# Patient Record
Sex: Male | Born: 1960
Health system: Southern US, Community
[De-identification: ages and names within clinical notes are randomized; demographics above are authoritative.]

## PROBLEM LIST (undated history)

## (undated) DIAGNOSIS — I471 Supraventricular tachycardia, unspecified: Secondary | ICD-10-CM

## (undated) DIAGNOSIS — E669 Obesity, unspecified: Secondary | ICD-10-CM

## (undated) DIAGNOSIS — H9313 Tinnitus, bilateral: Secondary | ICD-10-CM

## (undated) DIAGNOSIS — G2581 Restless legs syndrome: Secondary | ICD-10-CM

## (undated) DIAGNOSIS — M1990 Unspecified osteoarthritis, unspecified site: Secondary | ICD-10-CM

## (undated) DIAGNOSIS — J45909 Unspecified asthma, uncomplicated: Secondary | ICD-10-CM

## (undated) DIAGNOSIS — F419 Anxiety disorder, unspecified: Secondary | ICD-10-CM

## (undated) DIAGNOSIS — J302 Other seasonal allergic rhinitis: Secondary | ICD-10-CM

## (undated) DIAGNOSIS — N529 Male erectile dysfunction, unspecified: Secondary | ICD-10-CM

## (undated) HISTORY — DX: Other seasonal allergic rhinitis: J30.2

## (undated) HISTORY — DX: Restless legs syndrome: G25.81

## (undated) HISTORY — DX: Obesity, unspecified: E66.9

## (undated) HISTORY — DX: Anxiety disorder, unspecified: F41.9

## (undated) HISTORY — PX: KNEE SURGERY: SHX244

## (undated) HISTORY — PX: MOHS SURGERY: SHX181

## (undated) HISTORY — PX: OTHER SURGICAL HISTORY: SHX169

## (undated) HISTORY — DX: Unspecified osteoarthritis, unspecified site: M19.90

## (undated) HISTORY — DX: Supraventricular tachycardia, unspecified: I47.10

## (undated) HISTORY — DX: Tinnitus, bilateral: H93.13

---

## 2000-09-23 ENCOUNTER — Emergency Department (HOSPITAL_COMMUNITY): Admission: EM | Admit: 2000-09-23 | Discharge: 2000-09-23 | Payer: Self-pay | Admitting: Emergency Medicine

## 2002-11-10 ENCOUNTER — Encounter: Payer: Self-pay | Admitting: Family Medicine

## 2002-11-10 ENCOUNTER — Encounter: Admission: RE | Admit: 2002-11-10 | Discharge: 2002-11-10 | Payer: Self-pay | Admitting: Family Medicine

## 2004-09-04 ENCOUNTER — Ambulatory Visit: Payer: Self-pay | Admitting: Psychology

## 2004-09-19 ENCOUNTER — Ambulatory Visit: Payer: Self-pay | Admitting: Psychology

## 2004-09-24 ENCOUNTER — Ambulatory Visit: Payer: Self-pay | Admitting: Cardiology

## 2004-10-09 ENCOUNTER — Ambulatory Visit: Payer: Self-pay | Admitting: Psychology

## 2004-10-17 ENCOUNTER — Ambulatory Visit: Payer: Self-pay | Admitting: Psychology

## 2004-11-06 ENCOUNTER — Ambulatory Visit: Payer: Self-pay | Admitting: Psychology

## 2004-11-14 ENCOUNTER — Ambulatory Visit: Payer: Self-pay | Admitting: Psychology

## 2004-11-28 ENCOUNTER — Ambulatory Visit: Payer: Self-pay | Admitting: Psychology

## 2004-12-12 ENCOUNTER — Ambulatory Visit: Payer: Self-pay | Admitting: Psychology

## 2004-12-24 ENCOUNTER — Ambulatory Visit: Payer: Self-pay | Admitting: Psychology

## 2005-01-09 ENCOUNTER — Ambulatory Visit: Payer: Self-pay | Admitting: Psychology

## 2005-01-24 ENCOUNTER — Ambulatory Visit: Payer: Self-pay | Admitting: Psychology

## 2005-02-06 ENCOUNTER — Ambulatory Visit: Payer: Self-pay | Admitting: Psychology

## 2005-02-20 ENCOUNTER — Ambulatory Visit: Payer: Self-pay | Admitting: Psychology

## 2005-03-06 ENCOUNTER — Ambulatory Visit: Payer: Self-pay | Admitting: Psychology

## 2005-03-14 ENCOUNTER — Ambulatory Visit: Payer: Self-pay | Admitting: Psychology

## 2005-03-20 ENCOUNTER — Ambulatory Visit: Payer: Self-pay | Admitting: Psychology

## 2005-04-10 ENCOUNTER — Ambulatory Visit: Payer: Self-pay | Admitting: Psychology

## 2005-04-17 ENCOUNTER — Ambulatory Visit: Payer: Self-pay | Admitting: Psychology

## 2005-04-30 ENCOUNTER — Ambulatory Visit: Payer: Self-pay | Admitting: Psychology

## 2005-05-01 ENCOUNTER — Ambulatory Visit: Payer: Self-pay | Admitting: Pulmonary Disease

## 2005-05-01 ENCOUNTER — Ambulatory Visit: Payer: Self-pay | Admitting: Psychology

## 2005-05-08 ENCOUNTER — Ambulatory Visit: Payer: Self-pay | Admitting: Psychology

## 2005-05-15 ENCOUNTER — Ambulatory Visit: Payer: Self-pay | Admitting: Psychology

## 2005-05-22 ENCOUNTER — Ambulatory Visit: Payer: Self-pay | Admitting: Psychology

## 2005-06-05 ENCOUNTER — Ambulatory Visit: Payer: Self-pay | Admitting: Psychology

## 2005-06-12 ENCOUNTER — Ambulatory Visit: Payer: Self-pay | Admitting: Psychology

## 2005-06-26 ENCOUNTER — Ambulatory Visit: Payer: Self-pay | Admitting: Psychology

## 2005-07-03 ENCOUNTER — Ambulatory Visit: Payer: Self-pay | Admitting: Psychology

## 2005-07-10 ENCOUNTER — Ambulatory Visit: Payer: Self-pay | Admitting: Psychology

## 2005-07-31 ENCOUNTER — Ambulatory Visit: Payer: Self-pay | Admitting: Psychology

## 2005-08-07 ENCOUNTER — Ambulatory Visit: Payer: Self-pay | Admitting: Psychology

## 2005-10-18 ENCOUNTER — Ambulatory Visit: Payer: Self-pay | Admitting: Psychology

## 2005-10-30 ENCOUNTER — Ambulatory Visit: Payer: Self-pay | Admitting: Psychology

## 2005-11-13 ENCOUNTER — Ambulatory Visit: Payer: Self-pay | Admitting: Psychology

## 2005-11-27 ENCOUNTER — Ambulatory Visit: Payer: Self-pay | Admitting: Psychology

## 2005-12-11 ENCOUNTER — Ambulatory Visit: Payer: Self-pay | Admitting: Psychology

## 2005-12-25 ENCOUNTER — Ambulatory Visit: Payer: Self-pay | Admitting: Psychology

## 2006-01-08 ENCOUNTER — Ambulatory Visit: Payer: Self-pay | Admitting: Psychology

## 2006-02-05 ENCOUNTER — Ambulatory Visit: Payer: Self-pay | Admitting: Psychology

## 2006-02-19 ENCOUNTER — Ambulatory Visit: Payer: Self-pay | Admitting: Psychology

## 2006-03-05 ENCOUNTER — Ambulatory Visit: Payer: Self-pay | Admitting: Psychology

## 2006-03-19 ENCOUNTER — Ambulatory Visit: Payer: Self-pay | Admitting: Psychology

## 2006-04-02 ENCOUNTER — Ambulatory Visit: Payer: Self-pay | Admitting: Psychology

## 2006-04-16 ENCOUNTER — Ambulatory Visit: Payer: Self-pay | Admitting: Psychology

## 2006-04-30 ENCOUNTER — Ambulatory Visit: Payer: Self-pay | Admitting: Psychology

## 2006-05-14 ENCOUNTER — Ambulatory Visit: Payer: Self-pay | Admitting: Psychology

## 2006-05-28 ENCOUNTER — Ambulatory Visit: Payer: Self-pay | Admitting: Psychology

## 2006-06-04 ENCOUNTER — Ambulatory Visit: Payer: Self-pay | Admitting: Psychology

## 2006-06-18 ENCOUNTER — Ambulatory Visit: Payer: Self-pay | Admitting: Psychology

## 2006-07-02 ENCOUNTER — Ambulatory Visit: Payer: Self-pay | Admitting: Psychology

## 2006-07-30 ENCOUNTER — Ambulatory Visit: Payer: Self-pay | Admitting: Psychology

## 2006-08-13 ENCOUNTER — Ambulatory Visit: Payer: Self-pay | Admitting: Psychology

## 2006-09-10 ENCOUNTER — Ambulatory Visit: Payer: Self-pay | Admitting: Psychology

## 2006-10-08 ENCOUNTER — Ambulatory Visit: Payer: Self-pay | Admitting: Psychology

## 2006-12-17 ENCOUNTER — Ambulatory Visit: Payer: Self-pay | Admitting: Psychology

## 2007-01-14 ENCOUNTER — Ambulatory Visit: Payer: Self-pay | Admitting: Psychology

## 2007-02-17 ENCOUNTER — Ambulatory Visit: Payer: Self-pay | Admitting: Psychology

## 2007-03-05 ENCOUNTER — Ambulatory Visit: Payer: Self-pay | Admitting: Psychology

## 2007-04-09 ENCOUNTER — Ambulatory Visit: Payer: Self-pay | Admitting: Psychology

## 2007-06-25 ENCOUNTER — Ambulatory Visit: Payer: Self-pay | Admitting: Psychology

## 2007-07-23 ENCOUNTER — Ambulatory Visit: Payer: Self-pay | Admitting: Psychology

## 2007-08-19 ENCOUNTER — Ambulatory Visit: Payer: Self-pay | Admitting: Psychology

## 2007-11-24 ENCOUNTER — Ambulatory Visit: Payer: Self-pay | Admitting: Psychology

## 2008-03-07 ENCOUNTER — Ambulatory Visit: Payer: Self-pay | Admitting: Psychology

## 2008-03-16 ENCOUNTER — Ambulatory Visit: Payer: Self-pay | Admitting: Psychology

## 2008-03-22 ENCOUNTER — Ambulatory Visit: Payer: Self-pay | Admitting: Psychology

## 2008-04-05 ENCOUNTER — Ambulatory Visit: Payer: Self-pay | Admitting: Psychology

## 2008-04-18 ENCOUNTER — Ambulatory Visit: Payer: Self-pay | Admitting: Psychology

## 2008-05-03 ENCOUNTER — Ambulatory Visit: Payer: Self-pay | Admitting: Psychology

## 2008-08-09 ENCOUNTER — Ambulatory Visit: Payer: Self-pay | Admitting: Psychology

## 2013-03-06 ENCOUNTER — Other Ambulatory Visit: Payer: Self-pay | Admitting: Emergency Medicine

## 2013-03-08 NOTE — Telephone Encounter (Signed)
Pt has not been here at all since 2011 and had no record for several visits before that of [place] Rxing it. I did not send in a RF.

## 2013-03-08 NOTE — Telephone Encounter (Signed)
Please pull the patient's paper chart and find out whether we have been writing this prescription. If it is documented in the paper chart that I have written his Cialis and then we can go ahead and refill it. He has had no encounters since we have gone with epic.

## 2013-03-13 ENCOUNTER — Other Ambulatory Visit: Payer: Self-pay | Admitting: Emergency Medicine

## 2013-05-26 ENCOUNTER — Ambulatory Visit (INDEPENDENT_AMBULATORY_CARE_PROVIDER_SITE_OTHER): Payer: BC Managed Care – PPO | Admitting: Physician Assistant

## 2013-05-26 VITALS — BP 141/91 | HR 68 | Temp 98.0°F | Resp 17 | Ht 75.0 in | Wt 243.0 lb

## 2013-05-26 DIAGNOSIS — R5381 Other malaise: Secondary | ICD-10-CM

## 2013-05-26 DIAGNOSIS — R5383 Other fatigue: Secondary | ICD-10-CM

## 2013-05-26 DIAGNOSIS — N529 Male erectile dysfunction, unspecified: Secondary | ICD-10-CM

## 2013-05-26 DIAGNOSIS — F419 Anxiety disorder, unspecified: Secondary | ICD-10-CM | POA: Insufficient documentation

## 2013-05-26 LAB — POCT CBC
Lymph, poc: 2.5 (ref 0.6–3.4)
MCH, POC: 32.4 pg — AB (ref 27–31.2)
MCHC: 32.7 g/dL (ref 31.8–35.4)
MCV: 99.3 fL — AB (ref 80–97)
MID (cbc): 0.4 (ref 0–0.9)
POC Granulocyte: 4.4 (ref 2–6.9)
POC LYMPH PERCENT: 33.6 %L (ref 10–50)
POC MID %: 6 %M (ref 0–12)
Platelet Count, POC: 243 10*3/uL (ref 142–424)
RBC: 5.21 M/uL (ref 4.69–6.13)
RDW, POC: 13.1 %
WBC: 7.3 10*3/uL (ref 4.6–10.2)

## 2013-05-26 LAB — POCT URINALYSIS DIPSTICK
Bilirubin, UA: NEGATIVE
Blood, UA: NEGATIVE
Glucose, UA: NEGATIVE
Protein, UA: NEGATIVE
Spec Grav, UA: 1.025
Urobilinogen, UA: 0.2
pH, UA: 5.5

## 2013-05-26 NOTE — Progress Notes (Signed)
Subjective:    Patient ID: Ruben Snyder, male    DOB: 1961/10/22, 52 y.o.   MRN: [id number]  HPI 52 y.o. male presents with fatigue x 5 days but does feel like it is slightly improved since its onset. Patient has been feeling overly fatigued since Saturday. He was working on his boat over the weekend and feels like his muscles are very "heavy", like he worked out really hard but he states that he has not. He states that he is able to do his usual daily activities but that he just feels extremely worn out and decreased stamina. He is usually pretty physically active and does a lot of work outside. He has had issues with fatigue in the past for which he was told were viral related. He states that this feels very similar. He has history of depression in the past and states that he does not feel depressed at this time but his body feels like it did when he was depressed. He has also had a chronic dry cough for about 1 month that only occurs in the am but no other symptoms. He does have history of allergies but he states they are never bothersome enough to take medications. He has taken some Tylenol the past couple days but unsure if it has helped. He has not had any recent headaches, changes in vision, fever, chills, congestion, abdominal pain, changes in urination or bowel movements. Patient takes Cialis 2x/ week for  About 5 years now without any complications and uses Xanax as needed for anxiety without any complications.   Review of Systems  Constitutional: Positive for fatigue. Negative for fever, chills, activity change and appetite change.  HENT: Positive for tinnitus. Negative for congestion, sore throat, sneezing and sinus pressure.   Eyes: Negative for visual disturbance.  Respiratory: Positive for cough (dry, mostly in the mornings x 1 month). Negative for shortness of breath.   Cardiovascular: Negative for chest pain and palpitations.  Gastrointestinal: Negative for nausea, vomiting and  abdominal pain.  Musculoskeletal: Positive for myalgias (pt states its like muscle soreness or lactic acid build up after working out). Negative for arthralgias.  Skin: Negative for pallor and rash.  Allergic/Immunologic: Positive for environmental allergies.  Neurological: Negative for dizziness, light-headedness and headaches.  Psychiatric/Behavioral: Negative for confusion and sleep disturbance. Nervous/anxious: hx of anxiety    All other systems reviewed and are negative.      Objective:   Physical Exam  Nursing note and vitals reviewed. Constitutional: He is oriented to person, place, and time. Vital signs are normal. He appears well-developed and well-nourished. No distress.  HENT:  Head: Normocephalic and atraumatic.  Nose: Mucosal edema present.  Eyes: Conjunctivae, EOM and lids are normal. Pupils are equal, round, and reactive to light.  Neck: Trachea normal and normal range of motion. Neck supple. No thyromegaly present.  Cardiovascular: Normal rate, regular rhythm and normal heart sounds.   Pulmonary/Chest: Effort normal.  Abdominal: Soft. Bowel sounds are normal.  Musculoskeletal: Normal range of motion.  Lymphadenopathy:    He has no cervical adenopathy.  Neurological: He is alert and oriented to person, place, and time. He has normal strength and normal reflexes. No cranial nerve deficit or sensory deficit. He displays a negative Romberg sign.  Skin: Skin is warm and dry. No rash noted. He is not diaphoretic.  Psychiatric: He has a normal mood and affect. His speech is normal and behavior is normal. Thought content normal. Cognition and memory are normal.  Results for orders placed in visit on 05/26/13  POCT CBC      Result Value Range   WBC 7.3  4.6 - 10.2 K/uL   Lymph, poc 2.5  0.6 - 3.4   POC LYMPH PERCENT 33.6  10 - 50 %L   MID (cbc) 0.4  0 - 0.9   POC MID % 6.0  0 - 12 %M   POC Granulocyte 4.4  2 - 6.9   Granulocyte percent 60.4  37 - 80 %G   RBC 5.21  4.69  - 6.13 M/uL   Hemoglobin 16.9  14.1 - 18.1 g/dL   HCT, POC 47.8  29.5 - 53.7 %   MCV 99.3 (*) 80 - 97 fL   MCH, POC 32.4 (*) 27 - 31.2 pg   MCHC 32.7  31.8 - 35.4 g/dL   RDW, POC 62.1     Platelet Count, POC 243  142 - 424 K/uL   MPV 7.9  0 - 99.8 fL  POCT URINALYSIS DIPSTICK      Result Value Range   Color, UA dark yellow     Clarity, UA slightly cloudy     Glucose, UA negative     Bilirubin, UA negative     Ketones, UA negative     Spec Grav, UA 1.025     Blood, UA negative     pH, UA 5.5     Protein, UA negative     Urobilinogen, UA 0.2     Nitrite, UA negative     Leukocytes, UA Negative         Assessment & Plan:  Other malaise and fatigue - Plan: Comprehensive metabolic panel, POCT CBC, POCT urinalysis dipstick, CK  Patient appears to have fatigue with unsure cause at this time. Discussed with patient fatigue is most likely viral related or due to allergies. All labs were normal today. Pending results of CK and CMP to evaluate cause of myalgias and evaluate kidney function. Offered patient nasal allergy medication or OTC medication for allergies and patient will try OCT Zyrtec. Patient can continue to use Cialis and Xanax as directed. F/u in 1-2 weeks if still having fatigue for further evaluation.

## 2013-05-26 NOTE — Progress Notes (Signed)
I was directly involved with the patient's care and agree with the physical, diagnosis and treatment plan.  

## 2013-05-27 LAB — CK: Total CK: 118 U/L (ref 7–232)

## 2013-05-27 LAB — COMPREHENSIVE METABOLIC PANEL
Albumin: 4.9 g/dL (ref 3.5–5.2)
Alkaline Phosphatase: 50 U/L (ref 39–117)
BUN: 10 mg/dL (ref 6–23)
CO2: 26 mEq/L (ref 19–32)
Calcium: 9.7 mg/dL (ref 8.4–10.5)
Chloride: 103 mEq/L (ref 96–112)
Creat: 1.04 mg/dL (ref 0.50–1.35)
Glucose, Bld: 105 mg/dL — ABNORMAL HIGH (ref 70–99)
Potassium: 4.5 mEq/L (ref 3.5–5.3)
Total Bilirubin: 0.5 mg/dL (ref 0.3–1.2)

## 2013-10-03 ENCOUNTER — Ambulatory Visit (INDEPENDENT_AMBULATORY_CARE_PROVIDER_SITE_OTHER): Payer: BC Managed Care – PPO | Admitting: Emergency Medicine

## 2013-10-03 VITALS — BP 126/78 | HR 79 | Temp 98.3°F | Resp 18 | Ht 75.0 in | Wt 252.0 lb

## 2013-10-03 DIAGNOSIS — M79609 Pain in unspecified limb: Secondary | ICD-10-CM

## 2013-10-03 DIAGNOSIS — M549 Dorsalgia, unspecified: Secondary | ICD-10-CM

## 2013-10-03 DIAGNOSIS — N529 Male erectile dysfunction, unspecified: Secondary | ICD-10-CM

## 2013-10-03 LAB — POCT URINALYSIS DIPSTICK
Bilirubin, UA: NEGATIVE
Blood, UA: NEGATIVE
Glucose, UA: NEGATIVE
Ketones, UA: NEGATIVE
Leukocytes, UA: NEGATIVE
Nitrite, UA: NEGATIVE
Protein, UA: NEGATIVE
Spec Grav, UA: 1.02
Urobilinogen, UA: 0.2
pH, UA: 7

## 2013-10-03 MED ORDER — CYCLOBENZAPRINE HCL 10 MG PO TABS: 10.0000 mg | ORAL_TABLET | Freq: Three times a day (TID) | ORAL | Status: DC | PRN

## 2013-10-03 MED ORDER — TADALAFIL 10 MG PO TABS: ORAL_TABLET | ORAL | Status: DC

## 2013-10-03 NOTE — Patient Instructions (Signed)
Back Pain, Adult Low back pain is very common. About 1 in 5 people have back pain.The cause of low back pain is rarely dangerous. The pain often gets better over time.About half of people with a sudden onset of back pain feel better in just 2 weeks. About 8 in 10 people feel better by 6 weeks.  CAUSES Some common causes of back pain include:  Strain of the muscles or ligaments supporting the spine.  Wear and tear (degeneration) of the spinal discs.  Arthritis.  Direct injury to the back. DIAGNOSIS Most of the time, the direct cause of low back pain is not known.However, back pain can be treated effectively even when the exact cause of the pain is unknown.Answering your caregiver's questions about your overall health and symptoms is one of the most accurate ways to make sure the cause of your pain is not dangerous. If your caregiver needs more information, he or she may order lab work or imaging tests (X-rays or MRIs).However, even if imaging tests show changes in your back, this usually does not require surgery. HOME CARE INSTRUCTIONS For many people, back pain returns.Since low back pain is rarely dangerous, it is often a condition that people can learn to manageon their own.   Remain active. It is stressful on the back to sit or stand in one place. Do not sit, drive, or stand in one place for more than 30 minutes at a time. Take short walks on level surfaces as soon as pain allows.Try to increase the length of time you walk each day.  Do not stay in bed.Resting more than 1 or 2 days can delay your recovery.  Do not avoid exercise or work.Your body is made to move.It is not dangerous to be active, even though your back may hurt.Your back will likely heal faster if you return to being active before your pain is gone.  Pay attention to your body when you bend and lift. Many people have less discomfortwhen lifting if they bend their knees, keep the load close to their bodies,and  avoid twisting. Often, the most comfortable positions are those that put less stress on your recovering back.  Find a comfortable position to sleep. Use a firm mattress and lie on your side with your knees slightly bent. If you lie on your back, put a pillow under your knees.  Only take over-the-counter or prescription medicines as directed by your caregiver. Over-the-counter medicines to reduce pain and inflammation are often the most helpful.Your caregiver may prescribe muscle relaxant drugs.These medicines help dull your pain so you can more quickly return to your normal activities and healthy exercise.  Put ice on the injured area.  Put ice in a plastic bag.  Place a towel between your skin and the bag.  Leave the ice on for 15-20 minutes, 03-04 times a day for the first 2 to 3 days. After that, ice and heat may be alternated to reduce pain and spasms.  Ask your caregiver about trying back exercises and gentle massage. This may be of some benefit.  Avoid feeling anxious or stressed.Stress increases muscle tension and can worsen back pain.It is important to recognize when you are anxious or stressed and learn ways to manage it.Exercise is a great option. SEEK MEDICAL CARE IF:  You have pain that is not relieved with rest or medicine.  You have pain that does not improve in 1 week.  You have new symptoms.  You are generally not feeling well. SEEK   IMMEDIATE MEDICAL CARE IF:   You have pain that radiates from your back into your legs.  You develop new bowel or bladder control problems.  You have unusual weakness or numbness in your arms or legs.  You develop nausea or vomiting.  You develop abdominal pain.  You feel faint. Document Released: 10/21/2005 Document Revised: 04/21/2012 Document Reviewed: 03/11/2011 ExitCare Patient Information 2014 [employer].  

## 2013-10-03 NOTE — Progress Notes (Signed)
   Subjective:    Patient ID: Ruben Snyder, male    DOB: 1960-11-07, 52 y.o.   MRN: [id number]  HPI last night about 7 PM patient started having severe pain in his back. He is having severe muscle spasms and cramps. They come in waves. He denies any radicular symptoms. The pain is much worse if he twists or turns or changes positions. He has no history of kidney stones. Last night he took naproxen and Tylenol soon after that developed midepigastric abdominal pain. He took milk of magnesia with some relief. He denies any urgency or sensation he needs to urinate .    Review of Systems     Objective:   Physical Exam patient had approximately into spasm. He will be talking and then has a cramping sensation in his back. The area of tenderness appears to be over the lumbar spine. Lumbar muscles on the right. Deep tendon reflexes are 2+ and symmetrical motor strength 5 over 5 no weakness. Results for orders placed in visit on 10/03/13  POCT URINALYSIS DIPSTICK      Result Value Range   Color, UA yellow     Clarity, UA clear     Glucose, UA neg     Bilirubin, UA neg     Ketones, UA neg     Spec Grav, UA 1.020     Blood, UA neg     pH, UA 7.0     Protein, UA neg     Urobilinogen, UA 0.2     Nitrite, UA neg     Leukocytes, UA Negative           Assessment & Plan:  Patient does put ice pack to his back. I did put him on Flexeril 10 mg one half to one 3 times a day to take along with extra strength Tylenol a maximum of 6 per day. He does have a history of substance abuse so no addictive medications were prescribed. He also has a history of nonsteroidal GI problems.

## 2013-10-04 ENCOUNTER — Telehealth: Payer: Self-pay

## 2013-10-04 NOTE — Telephone Encounter (Signed)
Do you want to change the mg of the Cialis?

## 2013-10-04 NOTE — Telephone Encounter (Signed)
Patient's pharmacy [facility]  called requesting to change patients tadalafil (CIALIS) 10 MG tablet  to something cheaper like the 20mg  for the patient. Also the pharmacists says that the insurance will request a prior authorization too. Please advise

## 2013-10-05 MED ORDER — TADALAFIL 20 MG PO TABS: 10.0000 mg | ORAL_TABLET | Freq: Every day | ORAL | Status: DC | PRN

## 2013-10-05 NOTE — Telephone Encounter (Signed)
Okay, and Cialis 20 mg. He can take one half to one tab prior to intercourse he can have #6 tablets he can take the medication every 48 hours. He can have refills for one year.

## 2013-10-06 ENCOUNTER — Telehealth: Payer: Self-pay | Admitting: *Deleted

## 2013-10-06 NOTE — Telephone Encounter (Signed)
cialis approved from 09/06/13 til 10/05/16

## 2013-10-11 ENCOUNTER — Telehealth: Payer: Self-pay | Admitting: Radiology

## 2013-10-11 NOTE — Telephone Encounter (Signed)
cialis approved through 10/2016 left message for patient to advise.

## 2013-11-30 ENCOUNTER — Ambulatory Visit (INDEPENDENT_AMBULATORY_CARE_PROVIDER_SITE_OTHER): Payer: BC Managed Care – PPO | Admitting: Emergency Medicine

## 2013-11-30 VITALS — BP 144/80 | HR 109 | Temp 97.8°F | Resp 20 | Ht 75.0 in | Wt 251.6 lb

## 2013-11-30 DIAGNOSIS — J029 Acute pharyngitis, unspecified: Secondary | ICD-10-CM

## 2013-11-30 MED ORDER — PENICILLIN V POTASSIUM 500 MG PO TABS: 500.0000 mg | ORAL_TABLET | Freq: Four times a day (QID) | ORAL | Status: DC

## 2013-11-30 MED ORDER — PREDNISONE 20 MG PO TABS: 40.0000 mg | ORAL_TABLET | Freq: Every day | ORAL | Status: DC

## 2013-11-30 NOTE — Patient Instructions (Signed)
Pharyngitis °Pharyngitis is redness, pain, and swelling (inflammation) of your pharynx.  °CAUSES  °Pharyngitis is usually caused by infection. Most of the time, these infections are from viruses (viral) and are part of a cold. However, sometimes pharyngitis is caused by bacteria (bacterial). Pharyngitis can also be caused by allergies. Viral pharyngitis may be spread from person to person by coughing, sneezing, and personal items or utensils (cups, forks, spoons, toothbrushes). Bacterial pharyngitis may be spread from person to person by more intimate contact, such as kissing.  °SIGNS AND SYMPTOMS  °Symptoms of pharyngitis include:   °· Sore throat.   °· Tiredness (fatigue).   °· Low-grade fever.   °· Headache. °· Joint pain and muscle aches. °· Skin rashes. °· Swollen lymph nodes. °· Plaque-like film on throat or tonsils (often seen with bacterial pharyngitis). °DIAGNOSIS  °Your health care provider will ask you questions about your illness and your symptoms. Your medical history, along with a physical exam, is often all that is needed to diagnose pharyngitis. Sometimes, a rapid strep test is done. Other lab tests may also be done, depending on the suspected cause.  °TREATMENT  °Viral pharyngitis will usually get better in 3 4 days without the use of medicine. Bacterial pharyngitis is treated with medicines that kill germs (antibiotics).  °HOME CARE INSTRUCTIONS  °· Drink enough water and fluids to keep your urine clear or pale yellow.   °· Only take over-the-counter or prescription medicines as directed by your health care provider:   °· If you are prescribed antibiotics, make sure you finish them even if you start to feel better.   °· Do not take aspirin.   °· Get lots of rest.   °· Gargle with 8 oz of salt water (½ tsp of salt per 1 qt of water) as often as every 1 2 hours to soothe your throat.   °· Throat lozenges (if you are not at risk for choking) or sprays may be used to soothe your throat. °SEEK MEDICAL  CARE IF:  °· You have large, tender lumps in your neck. °· You have a rash. °· You cough up green, yellow-brown, or bloody spit. °SEEK IMMEDIATE MEDICAL CARE IF:  °· Your neck becomes stiff. °· You drool or are unable to swallow liquids. °· You vomit or are unable to keep medicines or liquids down. °· You have severe pain that does not go away with the use of recommended medicines. °· You have trouble breathing (not caused by a stuffy nose). °MAKE SURE YOU:  °· Understand these instructions. °· Will watch your condition. °· Will get help right away if you are not doing well or get worse. °Document Released: 10/21/2005 Document Revised: 08/11/2013 Document Reviewed: 06/28/2013 °ExitCare® Patient Information ©2014 [employer]. ° °

## 2013-11-30 NOTE — Progress Notes (Signed)
   Subjective:    Patient ID: Ruben Snyder, male    DOB: Nov 23, 1960, 53 y.o.   MRN: [id number]  HPI Sore throat x 2-3 days with one episode of emesis while traveling several days ago.  Symptoms caused him to miss his flight.  No other n/v.  States stomach feeling better, but throat worsening.  No headache.  No cough.  No f/c.  PPMH:  No diabetes, no history of htn.  SH:  Non smoker  ALL:  States NSAIDS irritate his stomach and does not tolerate them.  Review of Systems  Constitutional: Negative for fever and chills.  HENT: Positive for sore throat and trouble swallowing. Negative for congestion, ear pain, postnasal drip, rhinorrhea and voice change.   Respiratory: Negative for cough, choking, shortness of breath, wheezing and stridor.   Cardiovascular: Negative for chest pain.  Gastrointestinal: Positive for nausea and vomiting. Negative for diarrhea and constipation.  Endocrine: Positive for heat intolerance.  Musculoskeletal: Negative for myalgias.  Skin: Negative for rash.  Neurological: Negative for weakness, light-headedness, numbness and headaches.       Objective:   Physical Exam Blood pressure 144/80, pulse 109, temperature 97.8 F (36.6 C), temperature source Oral, resp. rate 20, height 6\' 3"  (1.905 m), weight 251 lb 9.6 oz (114.125 kg), SpO2 94.00%. Body mass index is 31.45 kg/(m^2). Well-developed, well nourished male  who is awake, alert and oriented, in NAD. HEENT: [place]/AT, PERRL, EOMI.  Sclera and conjunctiva are clear.  EAC are patent, TMs are normal in appearance. Nasal mucosa is pink and moist. OP is erythematous with midline uvula and no assymetry. Neck: supple, non-tender, positive adenopathy, thyromegaly. Heart: RRR, no murmur Lungs: normal effort, CTA Abdomen: normo-active bowel sounds, supple, non-tender, no mass or organomegaly. Extremities: no cyanosis, clubbing or edema. Skin: warm and dry without rash. Psychologic: good mood and appropriate affect,  normal speech and behavior.     Assessment & Plan:  Pharyngitis - in light of symptoms and discomfort with start on prednisone burst for inflammation/swelling and will start on penicillin.  Follow up as needed.

## 2014-10-04 ENCOUNTER — Other Ambulatory Visit: Payer: Self-pay | Admitting: Emergency Medicine

## 2014-10-04 NOTE — Telephone Encounter (Signed)
Dr Cleta Albertsaub, didn't know if you want to give pt 1 mos RF w/note to RTC, or just deny. Pended for review.

## 2014-11-03 ENCOUNTER — Ambulatory Visit (INDEPENDENT_AMBULATORY_CARE_PROVIDER_SITE_OTHER): Payer: BC Managed Care – PPO | Admitting: Physician Assistant

## 2014-11-03 ENCOUNTER — Other Ambulatory Visit: Payer: Self-pay | Admitting: Emergency Medicine

## 2014-11-03 VITALS — BP 138/70 | HR 69 | Temp 98.0°F | Resp 16 | Ht 74.75 in | Wt 224.0 lb

## 2014-11-03 DIAGNOSIS — R634 Abnormal weight loss: Secondary | ICD-10-CM

## 2014-11-03 DIAGNOSIS — N529 Male erectile dysfunction, unspecified: Secondary | ICD-10-CM

## 2014-11-03 MED ORDER — TADALAFIL 20 MG PO TABS: ORAL_TABLET | ORAL | Status: DC

## 2014-11-03 NOTE — Progress Notes (Signed)
    IDENTIFYING INFORMATION  Ruben Snyder / DOB: [birth year] / MRN: [id number]  The patient has Anxiety and Impotence on his problem list.  SUBJECTIVE  CC: Medication Refill and cialis   HPI: Mr. Ruben Snyder is a 53 y.o. y.o. male presenting for a refill of his Cialis.  He reports breaking the 20 mg tabs in half and uses the medication 12 times monthly.  He denies any difficulty with this medication. He denies orthostasis, vision changes and priapism.He has no history of CAD spectrum disorder.    Of note, he has lost roughly 20 lbs over the last year with diet changes.  He reports the cessation of highly processed foods.  He eats lost of vegetables and whole foods, and reports his BP is much better than previous.      He  has a past medical history of Depression and Anxiety.    He has a current medication list which includes the following prescription(s): alprazolam.  Mr. Ruben Snyder is allergic to nsaids and reglan. He  reports that he has been smoking Cigarettes.  He has been smoking about 0.00 packs per day. He has never used smokeless tobacco. He reports that he drinks alcohol. He reports that he does not use illicit drugs. He  reports that he currently engages in sexual activity. He reports using the following method of birth control/protection: Condom.  The patient  has no past surgical history on file.  His family history is not on file.  Review of Systems  Eyes: Negative.   Respiratory: Negative for shortness of breath.   Cardiovascular: Negative for palpitations.  Gastrointestinal: Negative for heartburn, nausea and abdominal pain.  Skin: Negative.   Neurological: Negative for dizziness and headaches.    OBJECTIVE  Blood pressure 138/70, pulse 69, temperature 98 F (36.7 C), temperature source Oral, resp. rate 16, height 6' 2.75" (1.899 m), weight 224 lb (101.606 kg), SpO2 98 %. The patient's body mass index is 28.18 kg/(m^2).  Physical Exam  Constitutional: He is  oriented to person, place, and time. He appears well-developed and well-nourished.  Cardiovascular: Normal rate and regular rhythm.   Respiratory: Effort normal and breath sounds normal.  GI: He exhibits no distension.  Musculoskeletal: Normal range of motion.  Neurological: He is alert and oriented to person, place, and time.  Skin: Skin is warm and dry.  Psychiatric: He has a normal mood and affect. His behavior is normal. Judgment and thought content normal.    No results found for this or any previous visit (from the past 24 hour(s)).  ASSESSMENT & PLAN  Ruben Snyder was seen today for medication refill and cialis.  Diagnoses and associated orders for this visit:  Erectile dysfunction, unspecified erectile dysfunction type - tadalafil (CIALIS) 20 MG tablet; Take 0.5 - 1 tab by mouth daily as needed.  Loss of weight -     Encouraged patient to continue his current weight loss regimen.      The patient was instructed to to call or comeback to clinic as needed, or should symptoms warrant.  Ruben Snyder, MHS, PA-C [facility] 11/03/2014 3:27 PM

## 2014-11-23 ENCOUNTER — Other Ambulatory Visit: Payer: Self-pay | Admitting: Family Medicine

## 2015-11-18 ENCOUNTER — Other Ambulatory Visit: Payer: Self-pay | Admitting: Family Medicine

## 2015-11-18 NOTE — Telephone Encounter (Signed)
Pt is requesting a refill of Cialis. He says he needs this ASAP.

## 2015-11-21 NOTE — Telephone Encounter (Signed)
Will refill for 1 month but will need a follow up for further refills

## 2015-12-12 ENCOUNTER — Other Ambulatory Visit: Payer: Self-pay

## 2015-12-12 ENCOUNTER — Ambulatory Visit: Payer: Self-pay | Admitting: Physician Assistant

## 2015-12-12 NOTE — Telephone Encounter (Signed)
Casimiro Needle,   [name].  He has reschedule his appointment to 01/10/2016 @ 4:00 pm, which was your first available appointment from 12/12/2015 cancellation.  He will run out in Feb.  [facility] [address] PT# [phone]

## 2015-12-14 MED ORDER — TADALAFIL 20 MG PO TABS: ORAL_TABLET | ORAL | Status: DC

## 2016-01-10 ENCOUNTER — Ambulatory Visit: Payer: Self-pay | Admitting: Physician Assistant

## 2016-01-31 ENCOUNTER — Ambulatory Visit (INDEPENDENT_AMBULATORY_CARE_PROVIDER_SITE_OTHER): Payer: BLUE CROSS/BLUE SHIELD | Admitting: Physician Assistant

## 2016-01-31 ENCOUNTER — Encounter: Payer: Self-pay | Admitting: Physician Assistant

## 2016-01-31 VITALS — BP 104/69 | HR 71 | Temp 98.1°F | Resp 16 | Ht 72.75 in | Wt 218.8 lb

## 2016-01-31 DIAGNOSIS — H9313 Tinnitus, bilateral: Secondary | ICD-10-CM | POA: Diagnosis not present

## 2016-01-31 MED ORDER — ALPRAZOLAM ER 1 MG PO TB24: 1.0000 mg | ORAL_TABLET | Freq: Every day | ORAL | Status: DC

## 2016-01-31 NOTE — Patient Instructions (Signed)
     IF you received an x-ray today, you will receive an invoice from [facility]. Please contact [facility] at [phone] with questions or concerns regarding your invoice.   IF you received labwork today, you will receive an invoice from [facility]. Please contact [facility] at [phone] with questions or concerns regarding your invoice.   Our billing staff will not be able to assist you with questions regarding bills from these companies.  You will be contacted with the lab results as soon as they are available. The fastest way to get your results is to activate your My Chart account. Instructions are located on the last page of this paperwork. If you have not heard from us regarding the results in 2 weeks, please contact this office.      

## 2016-01-31 NOTE — Progress Notes (Signed)
   02/07/2016 1:23 PM   DOB: Mar 19, 1961 / MRN: [id number]  SUBJECTIVE:  Ruben Snyder is a 55 y.o. male presenting for a refill of Xanax.  He reports a history of tinnitus and was placed on this by ENT, as they could find no etiology driving the tinnitus.  He is tearful in relaying this experience to me, and reports he only needs Xanax roughly 1-2 times per week, if that, to manage his emotional response to the tinnitus.    He is allergic to nsaids and reglan.   He  has a past medical history of Depression and Anxiety.    He  reports that he has been smoking Cigarettes.  He has never used smokeless tobacco. He reports that he drinks alcohol. He reports that he does not use illicit drugs. He  reports that he currently engages in sexual activity. He reports using the following method of birth control/protection: Condom. The patient  has no past surgical history on file.  His family history is not on file.  Review of Systems  Constitutional: Negative for fever and chills.  HENT: Negative for congestion and sore throat.   Eyes: Negative for photophobia.  Gastrointestinal: Negative for nausea.  Skin: Negative for rash.  Neurological: Negative for dizziness and headaches.    Problem list and medications reviewed and updated by myself where necessary, and exist elsewhere in the encounter.   OBJECTIVE:  BP 104/69 mmHg  Pulse 71  Temp(Src) 98.1 F (36.7 C) (Oral)  Resp 16  Ht 6' 0.75" (1.848 m)  Wt 218 lb 12.8 oz (99.247 kg)  BMI 29.06 kg/m2  Physical Exam  Constitutional: He is oriented to person, place, and time. He appears well-developed. He does not appear ill.  Eyes: Conjunctivae and EOM are normal. Pupils are equal, round, and reactive to light.  Cardiovascular: Normal rate and regular rhythm.   Pulmonary/Chest: Effort normal and breath sounds normal.  Abdominal: He exhibits no distension.  Musculoskeletal: Normal range of motion.  Neurological: He is alert and oriented to  person, place, and time. No cranial nerve deficit. Coordination normal.  Skin: Skin is warm and dry. He is not diaphoretic.  Psychiatric: He has a normal mood and affect.  Nursing note and vitals reviewed.   No results found for this or any previous visit (from the past 72 hour(s)).  No results found.  ASSESSMENT AND PLAN  Ruben Snyder was seen today for medication refill.  Diagnoses and all orders for this visit:  Tinnitus, bilateral: I think he may have some PTSD regarding this health scare.  He is very intelligent and did his own research prior to being diagnosed with a benign tinnitus.  Will continue his benzodiazepine therapy to manage acute episodes of panic associated with the disorder.  He is also using noise cancellation therapy and reports this is working.  Advised he continue this.    -     ALPRAZolam (XANAX XR) 1 MG 24 hr tablet; Take 1 tablet (1 mg total) by mouth daily.    The patient was advised to call or return to clinic if he does not see an improvement in symptoms or to seek the care of the closest emergency department if he worsens with the above plan.   Deliah BostonMichael Temeca Somma, MHS, PA-C [facility] 02/07/2016 1:23 PM

## 2016-02-19 ENCOUNTER — Ambulatory Visit (INDEPENDENT_AMBULATORY_CARE_PROVIDER_SITE_OTHER): Payer: BLUE CROSS/BLUE SHIELD | Admitting: Family Medicine

## 2016-02-19 ENCOUNTER — Telehealth: Payer: Self-pay

## 2016-02-19 ENCOUNTER — Ambulatory Visit (INDEPENDENT_AMBULATORY_CARE_PROVIDER_SITE_OTHER): Payer: BLUE CROSS/BLUE SHIELD

## 2016-02-19 VITALS — BP 118/80 | HR 73 | Temp 97.9°F | Resp 18 | Ht 72.75 in | Wt 215.6 lb

## 2016-02-19 DIAGNOSIS — J9801 Acute bronchospasm: Secondary | ICD-10-CM | POA: Diagnosis not present

## 2016-02-19 DIAGNOSIS — R079 Chest pain, unspecified: Secondary | ICD-10-CM

## 2016-02-19 DIAGNOSIS — J301 Allergic rhinitis due to pollen: Secondary | ICD-10-CM | POA: Diagnosis not present

## 2016-02-19 MED ORDER — FLUTICASONE PROPIONATE 50 MCG/ACT NA SUSP: 2.0000 | Freq: Every day | NASAL | Status: DC

## 2016-02-19 MED ORDER — ALBUTEROL SULFATE HFA 108 (90 BASE) MCG/ACT IN AERS: 2.0000 | INHALATION_SPRAY | Freq: Four times a day (QID) | RESPIRATORY_TRACT | Status: DC | PRN

## 2016-02-19 NOTE — Telephone Encounter (Signed)
Patient was seen today and is calling because the medication sent is not in stock at the pharmacy. Please advise!

## 2016-02-19 NOTE — Progress Notes (Signed)
Subjective:    Patient ID: Ruben Snyder, male    DOB: 06-21-1961, 55 y.o.   MRN: [id number]  02/19/2016  Wheezing   HPI This 55 y.o. male presents for evaluation of wheezing.  Onset six weeks ago; suffers with nighttime wheezing.  Almost whistling at times.  Tried Zyrtec for two days without improvement.  Only happens at nighttime.  Wakes up in morning congested and sneezing. Now feeling L sided chest pain.  Not sure if coughing something loose or if related to lungs.   Does not feel badly; no SOB.  Unusual.  Realizes is allergy time of year. When breathing in now, feels L anterior chest pain with deep inspiration mild. Feels with each breath.  Rubs tissue without soreness.  No fever/chills/sweats; no body aches.  No ear pain or sore throat.  +rhinorrhea; no sinus pressure.  No PND.  +sputum production with wheezing at night.  Clear.  No v/d.  No other medications other than Zyrtec.  Can tolerate Claritin.  Cannot tolerate Benadryl.  No tobacco; quit smoking 15 years ago.  No childhood asthma.  Some seasonal allergies.  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  No new homes.  Smoked for ten years.   Review of Systems  Constitutional: Negative for fever, chills, diaphoresis, activity change, appetite change and fatigue.  HENT: Positive for congestion, rhinorrhea and sneezing. Negative for ear pain and sore throat.   Respiratory: Positive for chest tightness and wheezing. Negative for cough and shortness of breath.   Cardiovascular: Positive for chest pain. Negative for palpitations and leg swelling.  Gastrointestinal: Negative for nausea, vomiting, abdominal pain and diarrhea.  Endocrine: Negative for cold intolerance, heat intolerance, polydipsia, polyphagia and polyuria.  Skin: Negative for color change, rash and wound.  Neurological: Negative for dizziness, tremors, seizures, syncope, facial asymmetry, speech difficulty, weakness, light-headedness, numbness and headaches.  Psychiatric/Behavioral:  Negative for sleep disturbance and dysphoric mood. The patient is not nervous/anxious.     Past Medical History  Diagnosis Date  . Depression   . Anxiety    History reviewed. No pertinent past surgical history. Allergies  Allergen Reactions  . Nsaids   . Reglan [Metoclopramide]     Social History   Social History  . Marital Status: Divorced    Spouse Name: N/A  . Number of Children: N/A  . Years of Education: N/A   Occupational History  . Not on file.   Social History Main Topics  . Smoking status: Current Some Day Smoker    Types: Cigarettes  . Smokeless tobacco: Never Used  . Alcohol Use: Yes  . Drug Use: No  . Sexual Activity: Yes    Birth Control/ Protection: Condom   Other Topics Concern  . Not on file   Social History Narrative   History reviewed. No pertinent family history.     Objective:    BP 118/80 mmHg  Pulse 73  Temp(Src) 97.9 F (36.6 C) (Oral)  Resp 18  Ht 6' 0.75" (1.848 m)  Wt 215 lb 9.6 oz (97.796 kg)  BMI 28.64 kg/m2  SpO2 97% Physical Exam  Constitutional: He is oriented to person, place, and time. He appears well-developed and well-nourished. No distress.  HENT:  Head: Normocephalic and atraumatic.  Right Ear: Tympanic membrane, external ear and ear canal normal.  Left Ear: Tympanic membrane, external ear and ear canal normal.  Nose: Nose normal.  Mouth/Throat: Oropharynx is clear and moist.  Eyes: Conjunctivae and EOM are normal. Pupils are equal, round,  and reactive to light.  Neck: Normal range of motion. Neck supple. Carotid bruit is not present. No thyromegaly present.  Cardiovascular: Normal rate, regular rhythm, normal heart sounds and intact distal pulses.  Exam reveals no gallop and no friction rub.   No murmur heard. Pulmonary/Chest: Effort normal and breath sounds normal. He has no wheezes. He has no rales.  Abdominal: Soft. Bowel sounds are normal. He exhibits no distension and no mass. There is no tenderness. There  is no rebound and no guarding.  Lymphadenopathy:    He has no cervical adenopathy.  Neurological: He is alert and oriented to person, place, and time. No cranial nerve deficit.  Skin: Skin is warm and dry. No rash noted. He is not diaphoretic.  Psychiatric: He has a normal mood and affect. His behavior is normal.  Nursing note and vitals reviewed.   Dg Chest 2 View  02/19/2016  CLINICAL DATA:  Left-sided chest pain EXAM: CHEST  2 VIEW COMPARISON:  07/27/2007 FINDINGS: The heart size and mediastinal contours are within normal limits. Both lungs are clear. The visualized skeletal structures are unremarkable. IMPRESSION: No active cardiopulmonary disease. Electronically Signed   By: Alcide Clever M.D.   On: 02/19/2016 19:25        Assessment & Plan:   1. Bronchospasm   2. Allergic rhinitis due to pollen   3. Left sided chest pain    -New. -Rx for Flonase, Albuterol provided. -Recommend Claritin  daily.   Orders Placed This Encounter  Procedures  . DG Chest 2 View    Standing Status: Future     Number of Occurrences: 1     Standing Expiration Date: 02/18/2017    Order Specific Question:  Reason for Exam (SYMPTOM  OR DIAGNOSIS REQUIRED)    Answer:  wheezing, L sided chest pain    Order Specific Question:  Preferred imaging location?    Answer:  External   Meds ordered this encounter  Medications  . albuterol (PROVENTIL HFA;VENTOLIN HFA) 108 (90 Base) MCG/ACT inhaler    Sig: Inhale 2 puffs into the lungs every 6 (six) hours as needed.    Dispense:  1 Inhaler    Refill:  1  . fluticasone (FLONASE) 50 MCG/ACT nasal spray    Sig: Place 2 sprays into both nostrils daily.    Dispense:  16 g    Refill:  6    No Follow-up on file.    Zaydn Gutridge Paulita Fujita, M.D. [facility] [address] [place], [place]  [address] [phone] phone [phone] fax

## 2016-02-19 NOTE — Patient Instructions (Addendum)
1. Take Claritin  one tablet daily.    IF you received an x-ray today, you will receive an invoice from [facility]. Please contact [facility] at [phone] with questions or concerns regarding your invoice.   IF you received labwork today, you will receive an invoice from [employer]. Please contact [facility] at [phone] with questions or concerns regarding your invoice.   Our billing staff will not be able to assist you with questions regarding bills from these companies.  You will be contacted with the lab results as soon as they are available. The fastest way to get your results is to activate your My Chart account. Instructions are located on the last page of this paperwork. If you have not heard from [place] regarding the results in 2 weeks, please contact this office.    Dg Chest 2 View  02/19/2016  CLINICAL DATA:  Left-sided chest pain EXAM: CHEST  2 VIEW COMPARISON:  07/27/2007 FINDINGS: The heart size and mediastinal contours are within normal limits. Both lungs are clear. The visualized skeletal structures are unremarkable. IMPRESSION: No active cardiopulmonary disease. Electronically Signed   By: Alcide Clever M.D.   On: 02/19/2016 19:25    Allergic Rhinitis Allergic rhinitis is when the mucous membranes in the nose respond to allergens. Allergens are particles in the air that cause your body to have an allergic reaction. This causes you to release allergic antibodies. Through a chain of events, these eventually cause you to release histamine into the blood stream. Although meant to protect the body, it is this release of histamine that causes your discomfort, such as frequent sneezing, congestion, and an itchy, runny nose.  CAUSES Seasonal allergic rhinitis (hay fever) is caused by pollen allergens that may come from grasses, trees, and weeds. Year-round allergic rhinitis (perennial allergic rhinitis) is caused by allergens such as  house dust mites, pet dander, and mold spores. SYMPTOMS  Nasal stuffiness (congestion).  Itchy, runny nose with sneezing and tearing of the eyes. DIAGNOSIS Your health care provider can help you determine the allergen or allergens that trigger your symptoms. If you and your health care provider are unable to determine the allergen, skin or blood testing may be used. Your health care provider will diagnose your condition after taking your health history and performing a physical exam. Your health care provider may assess you for other related conditions, such as asthma, pink eye, or an ear infection. TREATMENT Allergic rhinitis does not have a cure, but it can be controlled by:  Medicines that block allergy symptoms. These may include allergy shots, nasal sprays, and oral antihistamines.  Avoiding the allergen. Hay fever may often be treated with antihistamines in pill or nasal spray forms. Antihistamines block the effects of histamine. There are over-the-counter medicines that may help with nasal congestion and swelling around the eyes. Check with your health care provider before taking or giving this medicine. If avoiding the allergen or the medicine prescribed do not work, there are many new medicines your health care provider can prescribe. Stronger medicine may be used if initial measures are ineffective. Desensitizing injections can be used if medicine and avoidance does not work. Desensitization is when a patient is given ongoing shots until the body becomes less sensitive to the allergen. Make sure you follow up with your health care provider if problems continue. HOME CARE INSTRUCTIONS It is not possible to completely avoid allergens, but you can reduce your symptoms by taking steps to limit your exposure to  them. It helps to know exactly what you are allergic to so that you can avoid your specific triggers. SEEK MEDICAL CARE IF:  You have a fever.  You develop a cough that does not  stop easily (persistent).  You have shortness of breath.  You start wheezing.  Symptoms interfere with normal daily activities.   This information is not intended to replace advice given to you by your health care provider. Make sure you discuss any questions you have with your health care provider.   Document Released: 07/16/2001 Document Revised: 11/11/2014 Document Reviewed: 06/28/2013 Elsevier Interactive Patient Education [employer]2016 Elsevier Inc.

## 2016-02-20 NOTE — Telephone Encounter (Signed)
Called pt, he states he got his medicine.

## 2016-03-26 ENCOUNTER — Telehealth: Payer: Self-pay

## 2016-03-26 NOTE — Telephone Encounter (Signed)
Patient stated Claritin is not working. Dr. Katrinka BlazingSmith recommended Singular. Patient request for Singular to be called into [facility] on [address] [phone].

## 2016-03-26 NOTE — Telephone Encounter (Signed)
The claritin is not helping what? His allergies.  Does he continue to have wheezing?

## 2016-03-26 NOTE — Telephone Encounter (Signed)
Ok to send in.  

## 2016-03-28 ENCOUNTER — Telehealth: Payer: Self-pay

## 2016-03-28 NOTE — Telephone Encounter (Signed)
Pt wants the singulair, he hasn't noticed the wheezing since the singulair. He said that he can stop it to see if he will have wheezing but overall this medication has done its job.  Please advise  [phone]

## 2016-03-28 NOTE — Telephone Encounter (Signed)
Looks like this didn't get routed back for clarification. Please clarify what symptoms the patient continues to have.

## 2016-03-29 MED ORDER — MONTELUKAST SODIUM 10 MG PO TABS: 10.0000 mg | ORAL_TABLET | Freq: Every day | ORAL | Status: DC

## 2016-03-29 NOTE — Telephone Encounter (Signed)
Pt aware via phone that rx has been sent to pharmacy

## 2016-03-29 NOTE — Telephone Encounter (Signed)
Ruben Snyder at 03/28/2016 1:04 PM     Status: Signed       Expand All Collapse All   Pt wants the singulair, he hasn't noticed the wheezing since the singulair. He said that he can stop it to see if he will have wheezing but overall this medication has done its job.  Please advise  [phone]

## 2016-03-29 NOTE — Telephone Encounter (Signed)
Thanks for clarifying!  Meds ordered this encounter  Medications  . montelukast (SINGULAIR) 10 MG tablet    Sig: Take 1 tablet (10 mg total) by mouth at bedtime.    Dispense:  90 tablet    Refill:  3    Order Specific Question:  Supervising Provider    Answer:  DOOLITTLE, ROBERT P [[id number]]

## 2016-03-30 MED ORDER — MONTELUKAST SODIUM 10 MG PO TABS: 10.0000 mg | ORAL_TABLET | Freq: Every day | ORAL | Status: DC

## 2016-03-30 NOTE — Telephone Encounter (Signed)
Singulair sent in by PA Jeffery, PA-C.

## 2016-05-13 DIAGNOSIS — L821 Other seborrheic keratosis: Secondary | ICD-10-CM | POA: Diagnosis not present

## 2016-05-13 DIAGNOSIS — C44319 Basal cell carcinoma of skin of other parts of face: Secondary | ICD-10-CM | POA: Diagnosis not present

## 2016-05-13 DIAGNOSIS — C4441 Basal cell carcinoma of skin of scalp and neck: Secondary | ICD-10-CM | POA: Diagnosis not present

## 2016-05-13 DIAGNOSIS — L812 Freckles: Secondary | ICD-10-CM | POA: Diagnosis not present

## 2016-05-22 DIAGNOSIS — C44319 Basal cell carcinoma of skin of other parts of face: Secondary | ICD-10-CM | POA: Diagnosis not present

## 2016-10-07 ENCOUNTER — Ambulatory Visit (INDEPENDENT_AMBULATORY_CARE_PROVIDER_SITE_OTHER): Payer: BLUE CROSS/BLUE SHIELD | Admitting: Family Medicine

## 2016-10-07 VITALS — BP 124/80 | HR 85 | Temp 98.2°F | Resp 16 | Ht 72.0 in | Wt 224.0 lb

## 2016-10-07 DIAGNOSIS — F40243 Fear of flying: Secondary | ICD-10-CM

## 2016-10-07 DIAGNOSIS — J069 Acute upper respiratory infection, unspecified: Secondary | ICD-10-CM | POA: Diagnosis not present

## 2016-10-07 DIAGNOSIS — N529 Male erectile dysfunction, unspecified: Secondary | ICD-10-CM | POA: Diagnosis not present

## 2016-10-07 DIAGNOSIS — R300 Dysuria: Secondary | ICD-10-CM

## 2016-10-07 LAB — POC MICROSCOPIC URINALYSIS (UMFC): Mucus: ABSENT

## 2016-10-07 LAB — POCT URINALYSIS DIP (MANUAL ENTRY)
BILIRUBIN UA: NEGATIVE
Glucose, UA: NEGATIVE
Ketones, POC UA: NEGATIVE
Leukocytes, UA: NEGATIVE
NITRITE UA: NEGATIVE
PH UA: 8.5
Protein Ur, POC: NEGATIVE
RBC UA: NEGATIVE
Spec Grav, UA: 1.015
Urobilinogen, UA: 0.2

## 2016-10-07 MED ORDER — ALPRAZOLAM 1 MG PO TABS
1.0000 mg | ORAL_TABLET | Freq: Two times a day (BID) | ORAL | 1 refills | Status: AC | PRN
Start: 2016-10-07 — End: ?

## 2016-10-07 MED ORDER — BENZONATATE 100 MG PO CAPS: 100.0000 mg | ORAL_CAPSULE | Freq: Two times a day (BID) | ORAL | 0 refills | Status: DC | PRN

## 2016-10-07 MED ORDER — TADALAFIL 20 MG PO TABS: ORAL_TABLET | ORAL | 6 refills | Status: DC

## 2016-10-07 MED ORDER — GUAIFENESIN-CODEINE 100-10 MG/5ML PO SOLN: 5.0000 mL | Freq: Three times a day (TID) | ORAL | 0 refills | Status: DC | PRN

## 2016-10-07 NOTE — Patient Instructions (Addendum)
     IF you received an x-ray today, you will receive an invoice from [facility]. Please contact [facility] at [phone] with questions or concerns regarding your invoice.   IF you received labwork today, you will receive an invoice from [facility]. Please contact [facility] at [phone] with questions or concerns regarding your invoice.   Our billing staff will not be able to assist you with questions regarding bills from these companies.  You will be contacted with the lab results as soon as they are available. The fastest way to get your results is to activate your My Chart account. Instructions are located on the last page of this paperwork. If you have not heard from us regarding the results in 2 weeks, please contact this office.      Upper Respiratory Infection, Adult Most upper respiratory infections (URIs) are caused by a virus. A URI affects the nose, throat, and upper air passages. The most common type of URI is often called "the common cold." Follow these instructions at home:  Take medicines only as told by your doctor.  Gargle warm saltwater or take cough drops to comfort your throat as told by your doctor.  Use a warm mist humidifier or inhale steam from a shower to increase air moisture. This may make it easier to breathe.  Drink enough fluid to keep your pee (urine) clear or pale yellow.  Eat soups and other clear broths.  Have a healthy diet.  Rest as needed.  Go back to work when your fever is gone or your doctor says it is okay.  You may need to stay home longer to avoid giving your URI to others.  You can also wear a face mask and wash your hands often to prevent spread of the virus.  Use your inhaler more if you have asthma.  Do not use any tobacco products, including cigarettes, chewing tobacco, or electronic cigarettes. If you need help quitting, ask your doctor. Contact a doctor if:  You are getting  worse, not better.  Your symptoms are not helped by medicine.  You have chills.  You are getting more short of breath.  You have brown or red mucus.  You have yellow or brown discharge from your nose.  You have pain in your face, especially when you bend forward.  You have a fever.  You have puffy (swollen) neck glands.  You have pain while swallowing.  You have white areas in the back of your throat. Get help right away if:  You have very bad or constant:  Headache.  Ear pain.  Pain in your forehead, behind your eyes, and over your cheekbones (sinus pain).  Chest pain.  You have long-lasting (chronic) lung disease and any of the following:  Wheezing.  Long-lasting cough.  Coughing up blood.  A change in your usual mucus.  You have a stiff neck.  You have changes in your:  Vision.  Hearing.  Thinking.  Mood. This information is not intended to replace advice given to you by your health care provider. Make sure you discuss any questions you have with your health care provider. Document Released: 04/08/2008 Document Revised: 06/23/2016 Document Reviewed: 01/26/2014 Elsevier Interactive Patient Education  2017 Elsevier Inc.  

## 2016-10-07 NOTE — Progress Notes (Signed)
Chief Complaint  Patient presents with  . Sore Throat    x 1 wk  . Medication Refill    xanax and cilias  . Dysuria    x 8 wks/ mild pain  . Cough    congestion    HPI   Pt reports sore throat and cough for one week Afebrile Cough is productive of clear phlegm States that he has been using his flonase He states that he is using flonase bid He reports that he still takes his singulair No ear pain, no facial pain  Pt reports that he has a generalized anxiety that seems to get worse when he flies He reports that [place] is [place] to [place] on [place], a [place], from now and is already getting anxious about the flight.    He reports that he has a spot of pain on the end of the urethra States that the symptoms resolved  States that the symptoms are worse at the beginning of the stream No blood in the urine  He takes cialis for Erectile Dysfunction He denies any side effects He reports that he uses it sparingly and takes only a half tablet.    Past Medical History:  Diagnosis Date  . Anxiety   . Depression     Current Outpatient Prescriptions  Medication Sig Dispense Refill  . fluticasone (FLONASE) 50 MCG/ACT nasal spray Place 2 sprays into both nostrils daily. 16 g 6  . montelukast (SINGULAIR) 10 MG tablet Take 1 tablet (10 mg total) by mouth at bedtime. 90 tablet 3  . tadalafil (CIALIS) 20 MG tablet TAKE 0.5 - 1 TAB BY MOUTH DAILY AS NEEDED FOR ED. 6 tablet 6  . albuterol (PROVENTIL HFA;VENTOLIN HFA) 108 (90 Base) MCG/ACT inhaler Inhale 2 puffs into the lungs every 6 (six) hours as needed. (Patient not taking: Reported on 10/07/2016) 1 Inhaler 1  . ALPRAZolam (XANAX) 1 MG tablet Take 1 tablet (1 mg total) by mouth 2 (two) times daily as needed for anxiety. 20 tablet 1  . benzonatate (TESSALON) 100 MG capsule Take 1 capsule (100 mg total) by mouth 2 (two) times daily as needed for cough. 20 capsule 0   No current facility-administered medications for this visit.      Allergies:  Allergies  Allergen Reactions  . Nsaids   . Reglan [Metoclopramide]     No past surgical history on file.  Social History   Social History  . Marital status: Divorced    Spouse name: N/A  . Number of children: N/A  . Years of education: N/A   Social History Main Topics  . Smoking status: Current Some Day Smoker    Types: Cigarettes  . Smokeless tobacco: Never Used  . Alcohol use Yes  . Drug use: No  . Sexual activity: Yes    Birth control/ protection: Condom   Other Topics Concern  . None   Social History Narrative  . None    ROS  Objective: Vitals:   10/07/16 1107  BP: 124/80  Pulse: 85  Resp: 16  Temp: 98.2 F (36.8 C)  TempSrc: Oral  SpO2: 97%  Weight: 224 lb (101.6 kg)  Height: 6' (1.829 m)    Physical Exam General: alert, oriented, in NAD Head: normocephalic, atraumatic, no sinus tenderness Eyes: EOM intact, no scleral icterus or conjunctival injection Ears: TM clear bilaterally Throat: no pharyngeal exudate or erythema Lymph: no posterior auricular, submental or cervical lymph adenopathy Heart: normal rate, normal sinus rhythm, no murmurs Lungs:  clear to auscultation bilaterally, no wheezing   Assessment and Plan Ruben Snyder was seen today for sore throat, medication refill, dysuria and cough.  Diagnoses and all orders for this visit:  Acute uri- advised supportive care, likely viral etiology Cough syrup prescribed  Dysuria- no uti, advised cranberry -     POCT urinalysis dipstick -     POCT Microscopic Urinalysis ([facility])  Erectile dysfunction, unspecified erectile dysfunction type- refilled cialis  Fear of flying- changed xanax to immediate release since   Other orders -     tadalafil (CIALIS) 20 MG tablet; TAKE 0.5 - 1 TAB BY MOUTH DAILY AS NEEDED FOR ED. -     ALPRAZolam (XANAX) 1 MG tablet; Take 1 tablet (1 mg total) by mouth 2 (two) times daily as needed for anxiety. -     benzonatate (TESSALON) 100 MG capsule; Take 1  capsule (100 mg total) by mouth 2 (two) times daily as needed for cough.     Ruben Snyder [name]

## 2016-10-08 ENCOUNTER — Ambulatory Visit (HOSPITAL_COMMUNITY)
Admission: EM | Admit: 2016-10-08 | Discharge: 2016-10-08 | Disposition: A | Discharge status: Home / Self Care | Payer: BLUE CROSS/BLUE SHIELD | Attending: Family Medicine | Admitting: Family Medicine

## 2016-10-08 ENCOUNTER — Encounter (HOSPITAL_COMMUNITY): Payer: Self-pay | Admitting: Family Medicine

## 2016-10-08 DIAGNOSIS — J9801 Acute bronchospasm: Secondary | ICD-10-CM

## 2016-10-08 DIAGNOSIS — J40 Bronchitis, not specified as acute or chronic: Secondary | ICD-10-CM | POA: Diagnosis not present

## 2016-10-08 MED ORDER — SODIUM CHLORIDE 0.9 % IN NEBU
INHALATION_SOLUTION | RESPIRATORY_TRACT | Status: AC
  Filled 2016-10-08: qty 3

## 2016-10-08 MED ORDER — DOXYCYCLINE HYCLATE 100 MG PO CAPS: 100.0000 mg | ORAL_CAPSULE | Freq: Two times a day (BID) | ORAL | 0 refills | Status: DC

## 2016-10-08 MED ORDER — IPRATROPIUM-ALBUTEROL 0.5-2.5 (3) MG/3ML IN SOLN
3.0000 mL | Freq: Once | RESPIRATORY_TRACT | Status: AC
  Administered 2016-10-08: 3 mL via RESPIRATORY_TRACT

## 2016-10-08 MED ORDER — TRIAMCINOLONE ACETONIDE 40 MG/ML IJ SUSP
40.0000 mg | Freq: Once | INTRAMUSCULAR | Status: AC
  Administered 2016-10-08: 40 mg via INTRAMUSCULAR

## 2016-10-08 MED ORDER — PREDNISONE 50 MG PO TABS: ORAL_TABLET | ORAL | 0 refills | Status: DC

## 2016-10-08 MED ORDER — IPRATROPIUM-ALBUTEROL 0.5-2.5 (3) MG/3ML IN SOLN
RESPIRATORY_TRACT | Status: AC
  Filled 2016-10-08: qty 3

## 2016-10-08 MED ORDER — TRIAMCINOLONE ACETONIDE 40 MG/ML IJ SUSP
INTRAMUSCULAR | Status: AC
  Filled 2016-10-08: qty 1

## 2016-10-08 NOTE — Discharge Instructions (Signed)
Be sure to use 2 puffs of your albuterol inhaler every 4 hours as needed for cough and wheeze. Take the prednisone daily as directed and take with food. He may take your other medicines obtained at the urgent care as needed. Your best response will be from the inhalers and steroids. Take as directed.

## 2016-10-08 NOTE — ED Triage Notes (Signed)
Pt here for cough, fever. sts he was seen by his doctor recently and given cough syrup and using albuterol and Flonase. sts he is worried he may need a abx,.

## 2016-10-08 NOTE — ED Provider Notes (Signed)
CSN: [id number]     Arrival date & time 10/08/16  1950 History   First MD Initiated Contact with Patient 10/08/16 2015     Chief Complaint  Patient presents with  . Cough   (Consider location/radiation/quality/duration/timing/severity/associated sxs/prior Treatment) 55 year old male states that 3 days ago he developed sore throat PND and then a day and half to 2 days later developed cough and wheezing. He went to an urgent care yesterday and was treated with a beer all inhaler, codeine cough medicine and Tessalon Perles. He states that he feels like he is not getting any better. He is using the albuterol 1 puff 3 times a day only. He has increased his codeine cough syrup. Intake. He states he has had a low-grade fever this afternoon. Currently is 98.9. He does not smoke but he was told this summer that he had nocturnal asthma.      Past Medical History:  Diagnosis Date  . Anxiety   . Depression    History reviewed. No pertinent surgical history. History reviewed. No pertinent family history. Social History  Substance Use Topics  . Smoking status: Current Some Day Smoker    Types: Cigarettes  . Smokeless tobacco: Never Used  . Alcohol use Yes    Review of Systems  Constitutional: Negative for activity change and fatigue.  HENT: Negative.   Respiratory: Positive for shortness of breath and wheezing. Negative for chest tightness.   Cardiovascular: Negative for chest pain.  Gastrointestinal: Negative.   Genitourinary: Negative.   Skin: Negative.   Neurological: Negative.   All other systems reviewed and are negative.   Allergies  Nsaids and Reglan [metoclopramide]  Home Medications   Prior to Admission medications   Medication Sig Start Date End Date Taking? Authorizing Provider  albuterol (PROVENTIL HFA;VENTOLIN HFA) 108 (90 Base) MCG/ACT inhaler Inhale 2 puffs into the lungs every 6 (six) hours as needed. Patient not taking: Reported on 10/07/2016 02/19/16   Ethelda ChickKristi M  Smith, MD  ALPRAZolam Prudy Feeler(XANAX) 1 MG tablet Take 1 tablet (1 mg total) by mouth 2 (two) times daily as needed for anxiety. 10/07/16   Doristine BosworthZoe A Stallings, MD  benzonatate (TESSALON) 100 MG capsule Take 1 capsule (100 mg total) by mouth 2 (two) times daily as needed for cough. 10/07/16   Doristine BosworthZoe A Stallings, MD  doxycycline (VIBRAMYCIN) 100 MG capsule Take 1 capsule (100 mg total) by mouth 2 (two) times daily. 10/08/16   Hayden Rasmussenavid Azaylea Maves, NP  fluticasone (FLONASE) 50 MCG/ACT nasal spray Place 2 sprays into both nostrils daily. 02/19/16   Ethelda ChickKristi M Smith, MD  guaiFENesin-codeine 100-10 MG/5ML syrup Take 5 mLs by mouth 3 (three) times daily as needed for cough. 10/07/16   Doristine BosworthZoe A Stallings, MD  montelukast (SINGULAIR) 10 MG tablet Take 1 tablet (10 mg total) by mouth at bedtime. 03/30/16   Ethelda ChickKristi M Smith, MD  predniSONE (DELTASONE) 50 MG tablet 1 tab po daily for 6 days. Take with food. 10/08/16   Hayden Rasmussenavid Syndey Jaskolski, NP  tadalafil (CIALIS) 20 MG tablet TAKE 0.5 - 1 TAB BY MOUTH DAILY AS NEEDED FOR ED. 10/07/16   Doristine BosworthZoe A Stallings, MD   Meds Ordered and Administered this Visit   Medications  ipratropium-albuterol (DUONEB) 0.5-2.5 (3) MG/3ML nebulizer solution 3 mL (3 mLs Nebulization Given 10/08/16 2049)  triamcinolone acetonide (KENALOG-40) injection 40 mg (40 mg Intramuscular Given 10/08/16 2053)    BP 128/84 (BP Location: Left Arm)   Pulse 100   Temp 98.9 F (37.2 C) (Oral)  Resp 16   SpO2 95%  No data found.   Physical Exam  Constitutional: He is oriented to person, place, and time. He appears well-developed and well-nourished. No distress.  HENT:  Head: Normocephalic and atraumatic.  Mouth/Throat: Oropharynx is clear and moist.  Neck: Neck supple.  Cardiovascular: Normal rate and regular rhythm.   Pulmonary/Chest: Effort normal. He has no rales.  Prolonged expiratory phase, bilateral diffuse wheezing and coarseness. Cough with deep inspiration.  Neurological: He is alert and oriented to person, place, and time.   Skin: Skin is warm and dry. Capillary refill takes less than 2 seconds.  Psychiatric: He has a normal mood and affect.  Nursing note and vitals reviewed.   Urgent Care Course   Clinical Course     Procedures (including critical care time)  Labs Review Labs Reviewed - No data to display  Imaging Review No results found.   Visual Acuity Review  Right Eye Distance:   Left Eye Distance:   Bilateral Distance:    Right Eye Near:   Left Eye Near:    Bilateral Near:         MDM   1. Cough due to bronchospasm   2. Bronchitis    Be sure to use 2 puffs of your albuterol inhaler every 4 hours as needed for cough and wheeze. Take the prednisone daily as directed and take with food. He may take your other medicines obtained at the urgent care as needed. Your best response will be from the inhalers and steroids. Take as directed. Meds ordered this encounter  Medications  . ipratropium-albuterol (DUONEB) 0.5-2.5 (3) MG/3ML nebulizer solution 3 mL  . triamcinolone acetonide (KENALOG-40) injection 40 mg  . predniSONE (DELTASONE) 50 MG tablet    Sig: 1 tab po daily for 6 days. Take with food.    Dispense:  6 tablet    Refill:  0    Order Specific Question:   Supervising Provider    Answer:   Linna HoffKINDL, JAMES D [id number]  . doxycycline (VIBRAMYCIN) 100 MG capsule    Sig: Take 1 capsule (100 mg total) by mouth 2 (two) times daily.    Dispense:  20 capsule    Refill:  0    Order Specific Question:   Supervising Provider    Answer:   Linna HoffKINDL, JAMES D [[id number]]       Hayden Rasmussenavid Madisson Kulaga, NP 10/08/16 2055

## 2016-10-16 ENCOUNTER — Telehealth: Payer: Self-pay

## 2016-10-16 NOTE — Telephone Encounter (Signed)
Patient came in with papers needed [facility]  Papers were left in the nurses box at 102.  Patient phone: [phone]

## 2016-10-22 NOTE — Telephone Encounter (Signed)
This patient dropped off a form to be filled out on 10-16-16 and he has not heard from us.  He was sick and due to that he missed a flight.  The form is for the airport to which I think he will be reimbursed once complete.  The form is to be faxed to him at [phone] (this is not his phone # as the previous message says.)  Please call him asap [phone]

## 2016-10-22 NOTE — Telephone Encounter (Signed)
Pt CB and stated that the fax # he left is his fax, so he will get the form and sign it before taking/sending to airline. Faxed w/confirmation and scanned form.

## 2016-10-22 NOTE — Telephone Encounter (Signed)
I have form completed and signed by Dr Creta LevinStallings at my desk, but will hold off faxing until I hear from pt since I don't have his signature on the form. LMOM for pt to CB.

## 2016-12-03 ENCOUNTER — Other Ambulatory Visit: Payer: Self-pay | Admitting: Family Medicine

## 2017-01-07 ENCOUNTER — Other Ambulatory Visit: Payer: Self-pay | Admitting: Family Medicine

## 2017-01-07 NOTE — Telephone Encounter (Signed)
Dr. Creta LevinStallings provided a one time prescription for fear of flying per her last office note. Will not refill as she is out of office. Will forward to her to review.  Godfrey PickKimberly S. Tiburcio PeaHarris, MSN, FNP-C Primary Care at [facility] [phone]

## 2017-01-29 DIAGNOSIS — H52223 Regular astigmatism, bilateral: Secondary | ICD-10-CM | POA: Diagnosis not present

## 2017-02-04 ENCOUNTER — Ambulatory Visit (INDEPENDENT_AMBULATORY_CARE_PROVIDER_SITE_OTHER): Payer: BLUE CROSS/BLUE SHIELD | Admitting: Student

## 2017-02-04 DIAGNOSIS — J209 Acute bronchitis, unspecified: Secondary | ICD-10-CM

## 2017-02-04 MED ORDER — GUAIFENESIN-CODEINE 100-10 MG/5ML PO SOLN: 5.0000 mL | Freq: Four times a day (QID) | ORAL | 0 refills | Status: DC | PRN

## 2017-02-04 NOTE — Patient Instructions (Signed)
     IF you received an x-ray today, you will receive an invoice from [facility]. Please contact [facility] at [phone] with questions or concerns regarding your invoice.   IF you received labwork today, you will receive an invoice from [facility]. Please contact [facility] at [phone] with questions or concerns regarding your invoice.   Our billing staff will not be able to assist you with questions regarding bills from these companies.  You will be contacted with the lab results as soon as they are available. The fastest way to get your results is to activate your My Chart account. Instructions are located on the last page of this paperwork. If you have not heard from us regarding the results in 2 weeks, please contact this office.     

## 2017-02-04 NOTE — Progress Notes (Signed)
   Subjective:    Patient ID: Ruben Snyder, male    DOB: [birth year], 56 y.o.   MRN: [id number]  HPI Started as a cough, fevers, chills.  Now having dry cough that has been persistent.  It began 3-4 weeks ago with a productive cough, fevers, chills.  It is now a nonproductive cough without the other symptoms.  He has a history of asthma.  He is using 6 puffs per day albuterol, does not seem to help.  He was previously using cough syrup with some night time relief, but has run out.  Denies weight loss, current fevers, hemoptysis.  He has had this before.  Had a negative x-ray in April '17.  He has done well with prednisone in the past, but would not like to do this today.   PMHx - Updated and reviewed.  Contributory factors include: asthma PSHx - Updated and reviewed.  Contributory factors include:  Negative FHx - Updated and reviewed.  Contributory factors include:  Negative Social Hx - Updated and reviewed. Contributory factors include: Negative Medications - reviewed   Review of Systems  Constitutional: Negative for chills, fatigue and fever.  HENT: Negative for congestion, ear pain, postnasal drip, rhinorrhea and sore throat.   Eyes: Negative for pain and itching.  Respiratory: Positive for cough and wheezing. Negative for apnea, chest tightness and shortness of breath.   Cardiovascular: Negative for chest pain, palpitations and leg swelling.  Genitourinary: Negative for dysuria and urgency.  Musculoskeletal: Negative for joint swelling and myalgias.  Skin: Negative for pallor, rash and wound.  Neurological: Negative for dizziness and weakness.  All other systems reviewed and are negative.      Objective:   Physical Exam  Constitutional: He is oriented to person, place, and time. He appears well-developed and well-nourished. No distress.  HENT:  Head: Normocephalic and atraumatic.  Right Ear: External ear normal.  Left Ear: External ear normal.  Nose: Nose normal.    Mouth/Throat: Oropharynx is clear and moist. No oropharyngeal exudate.  Eyes: Conjunctivae are normal. No scleral icterus.  Neck: Normal range of motion. Neck supple.  Cardiovascular: Normal rate and regular rhythm.  Exam reveals no gallop and no friction rub.   No murmur heard. Pulmonary/Chest: Effort normal and breath sounds normal. No respiratory distress. He has no wheezes. He has no rales.  Musculoskeletal: He exhibits no edema or deformity.  Neurological: He is alert and oriented to person, place, and time.  Skin: Skin is warm. No rash noted. He is not diaphoretic. No erythema. No pallor.  Psychiatric: He has a normal mood and affect. His behavior is normal. Judgment and thought content normal.   BP 119/82   Pulse 73   Temp 97.7 F (36.5 C) (Oral)   Resp 18   Ht 6' (1.829 m)   Wt 216 lb (98 kg)   SpO2 97%   BMI 29.29 kg/m         Assessment & Plan:  Bronchospasm with bronchitis, acute Continue albuterol when wheezing.  Cough syrup at night. Continue allergy treatment.  Offered prednisone, but would like to wait.  Follow up 2 weeks if not improved.     Signed,  Corliss Marcus, DO [facility] 8:09 PM

## 2017-02-04 NOTE — Assessment & Plan Note (Signed)
Continue albuterol when wheezing.  Cough syrup at night. Continue allergy treatment.  Offered prednisone, but would like to wait.  Follow up 2 weeks if not improved.

## 2017-03-08 IMAGING — CR DG CHEST 2V
4 series · 4 of 4 positions shown · non-contrast
Comparison: 07/27/2007

CLINICAL DATA: Left-sided chest pain

EXAM:
CHEST  2 VIEW

[PA (1 of 2)]
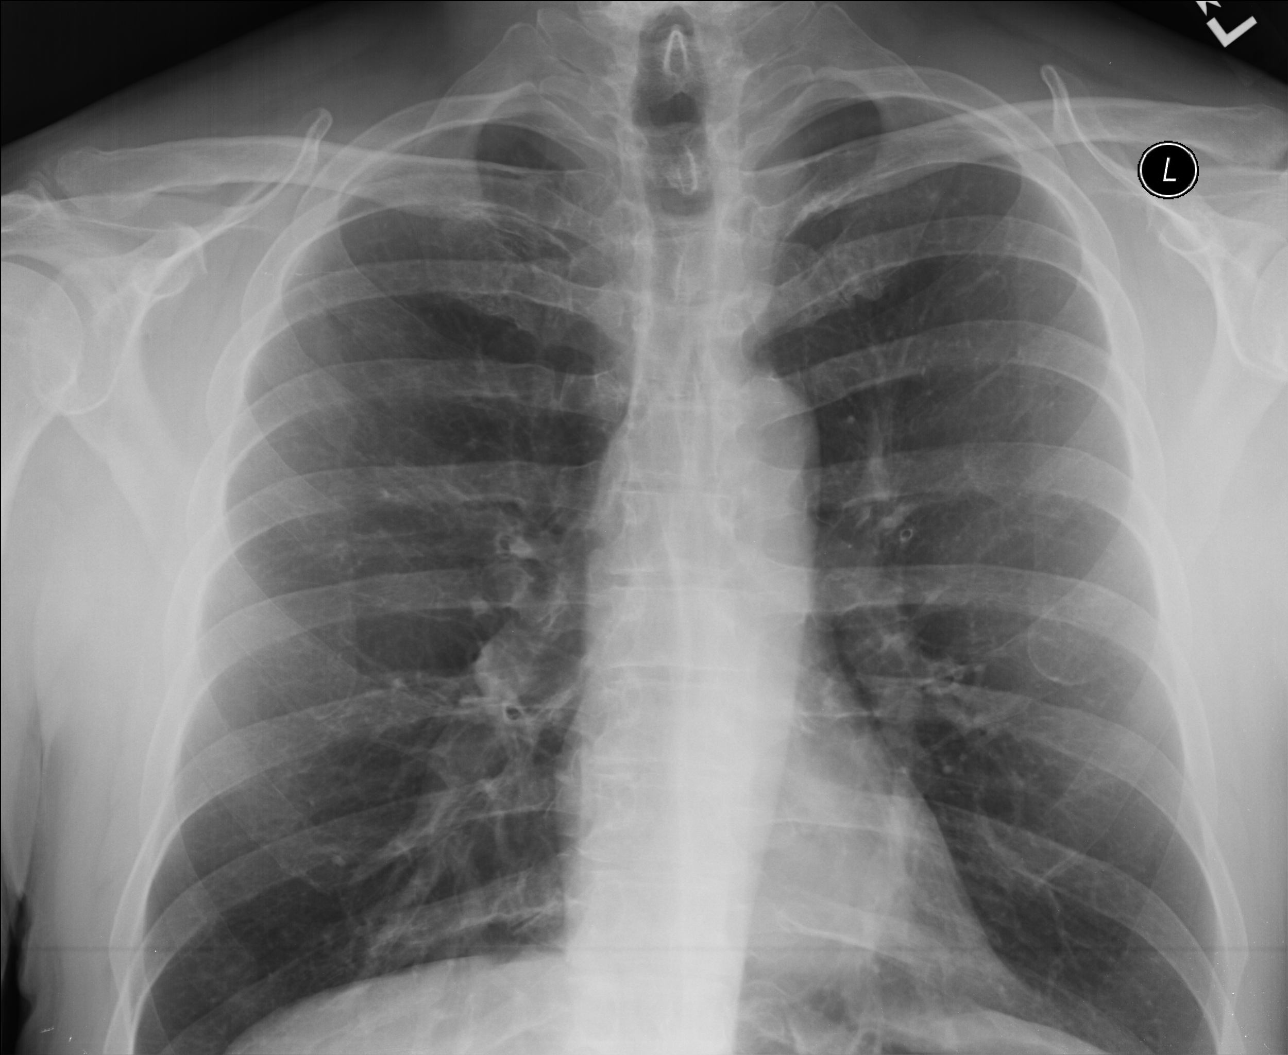

[lateral (1 of 2)]
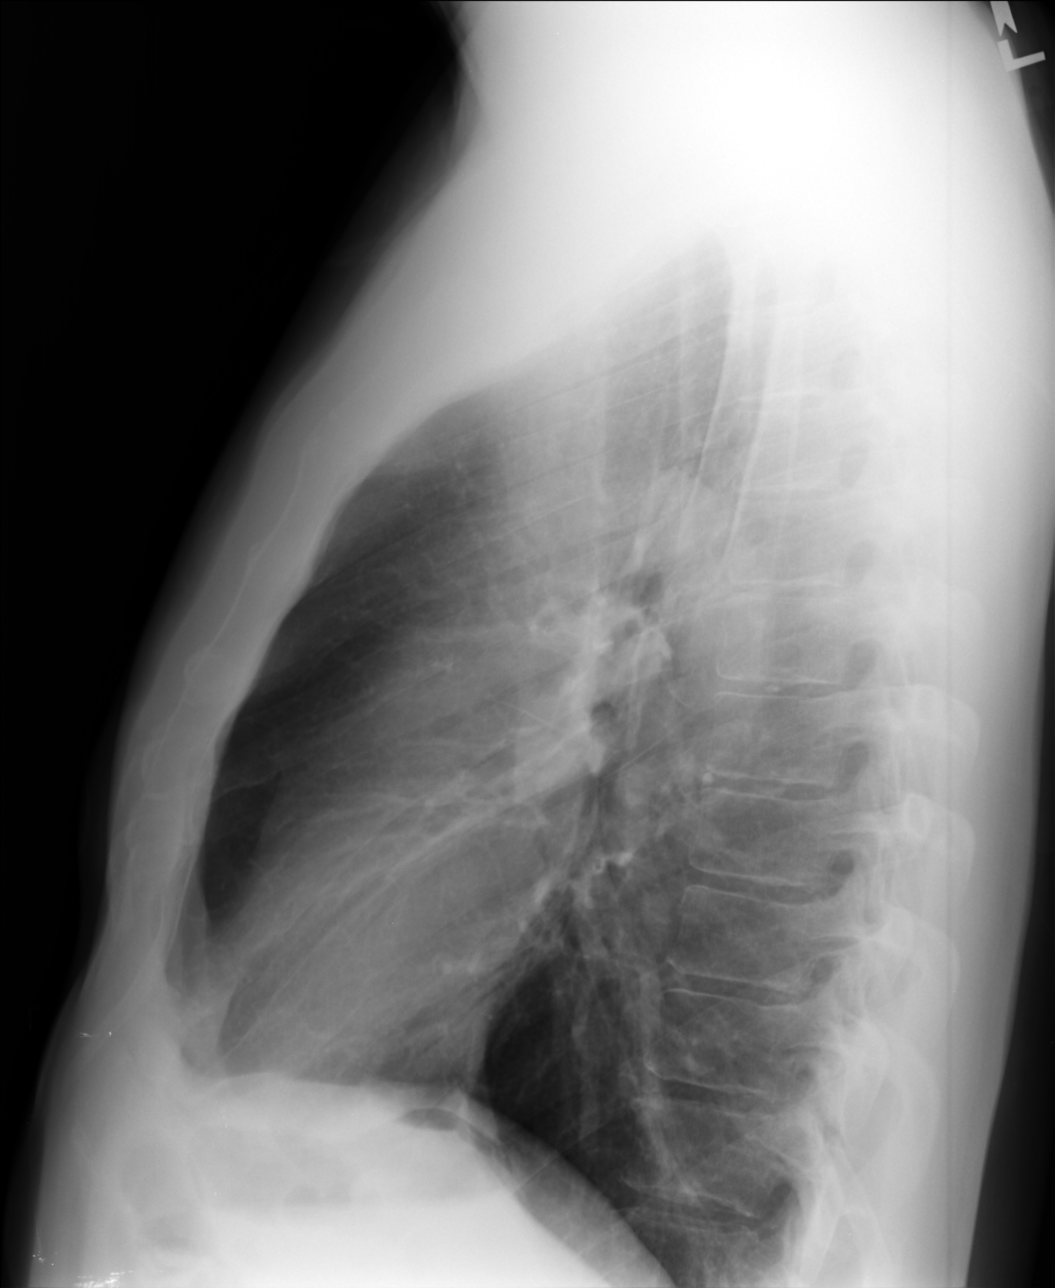

[PA (2 of 2)]
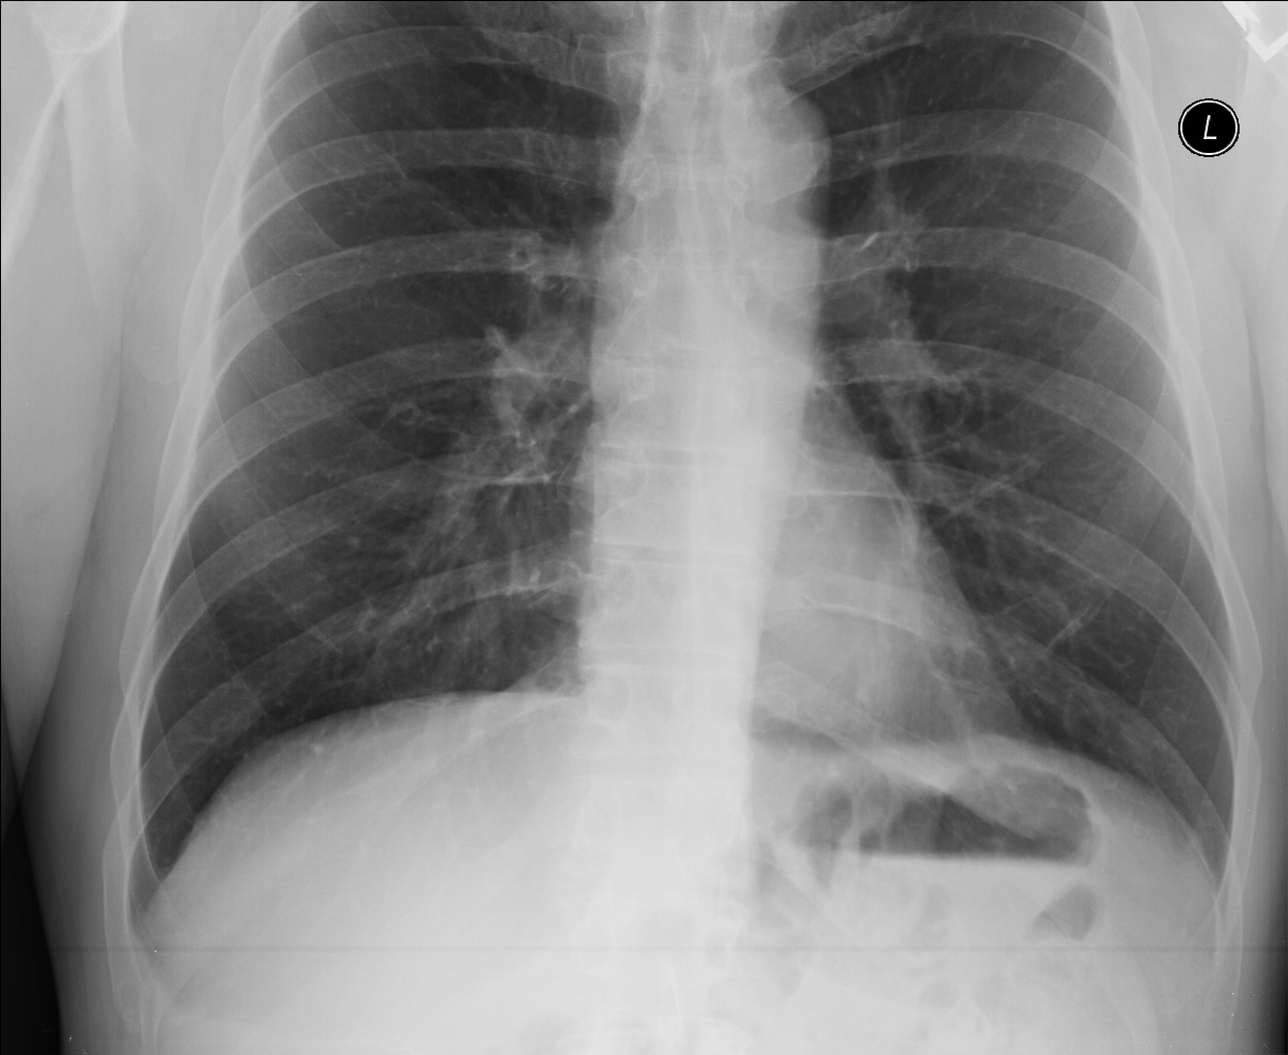

[lateral (2 of 2)]
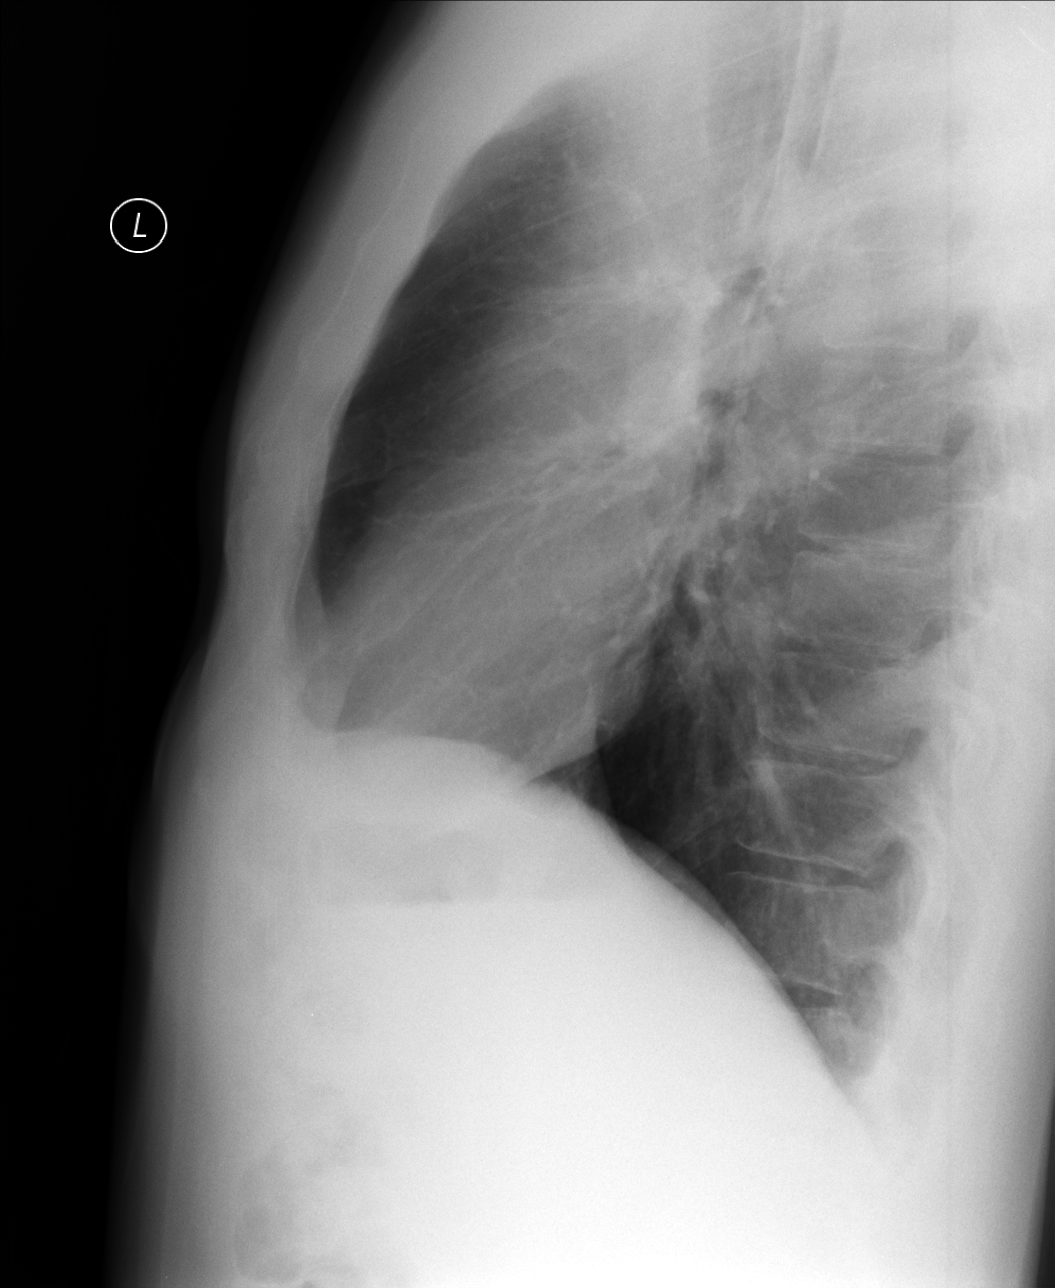

[4 of 4 positions shown; findings below may reference images not displayed]

FINDINGS: The heart size and mediastinal contours are within normal limits.
Both lungs are clear. The visualized skeletal structures are
unremarkable.
IMPRESSION: No active cardiopulmonary disease.

## 2017-03-25 ENCOUNTER — Other Ambulatory Visit: Payer: Self-pay | Admitting: Family Medicine

## 2017-03-26 ENCOUNTER — Encounter: Payer: Self-pay | Admitting: Family Medicine

## 2017-03-26 ENCOUNTER — Ambulatory Visit (INDEPENDENT_AMBULATORY_CARE_PROVIDER_SITE_OTHER): Payer: BLUE CROSS/BLUE SHIELD | Admitting: Family Medicine

## 2017-03-26 VITALS — BP 132/86 | HR 72 | Temp 97.8°F | Resp 16 | Ht 75.0 in | Wt 221.0 lb

## 2017-03-26 DIAGNOSIS — N529 Male erectile dysfunction, unspecified: Secondary | ICD-10-CM | POA: Diagnosis not present

## 2017-03-26 DIAGNOSIS — R430 Anosmia: Secondary | ICD-10-CM | POA: Diagnosis not present

## 2017-03-26 MED ORDER — TADALAFIL 20 MG PO TABS: ORAL_TABLET | ORAL | 6 refills | Status: DC

## 2017-03-26 NOTE — Patient Instructions (Signed)
     IF you received an x-ray today, you will receive an invoice from [facility]. Please contact [facility] at [phone] with questions or concerns regarding your invoice.   IF you received labwork today, you will receive an invoice from [facility]. Please contact [facility] at [phone] with questions or concerns regarding your invoice.   Our billing staff will not be able to assist you with questions regarding bills from these companies.  You will be contacted with the lab results as soon as they are available. The fastest way to get your results is to activate your My Chart account. Instructions are located on the last page of this paperwork. If you have not heard from us regarding the results in 2 weeks, please contact this office.     

## 2017-03-26 NOTE — Progress Notes (Signed)
Chief Complaint  Patient presents with  . LOSS OF SMELL    and TASTE X 1 WEEK AFTER SMELLING OF GASOLINE for 1 hour  . Medication Refill    CIALIS    HPI  Patient reports that he was sitting outside and was near an area with gasoline fumes He states that after exposure for an hour he states that he was bothered by it He states that he was about 815ft away from it He noticed after that he could smell nothing but gasoline for 2-3 hours after leaving the area Despite flushing with saline and using flonase he has lost his sense of smell.  Cialis refill Denies any side effects of Cialis He states that   Past Medical History:  Diagnosis Date  . Anxiety   . Depression     Current Outpatient Prescriptions  Medication Sig Dispense Refill  . ALPRAZolam (XANAX) 1 MG tablet Take 1 tablet (1 mg total) by mouth 2 (two) times daily as needed for anxiety. 20 tablet 1  . fluticasone (FLONASE) 50 MCG/ACT nasal spray Place 2 sprays into both nostrils daily. 16 g 6  . montelukast (SINGULAIR) 10 MG tablet Take 1 tablet (10 mg total) by mouth at bedtime. 90 tablet 3  . PROAIR HFA 108 (90 Base) MCG/ACT inhaler INHALE 2 PUFFS INTO THE LUNGS EVERY 6 (SIX) HOURS AS NEEDED. 8.5 Inhaler 1  . tadalafil (CIALIS) 20 MG tablet TAKE 0.5 - 1 TAB BY MOUTH DAILY AS NEEDED FOR ED. 6 tablet 6   No current facility-administered medications for this visit.     Allergies:  Allergies  Allergen Reactions  . Nsaids   . Reglan [Metoclopramide]     No past surgical history on file.  Social History   Social History  . Marital status: Divorced    Spouse name: N/A  . Number of children: N/A  . Years of education: N/A   Social History Main Topics  . Smoking status: Former Smoker    Types: Cigarettes  . Smokeless tobacco: Never Used  . Alcohol use Yes  . Drug use: No  . Sexual activity: Yes    Birth control/ protection: Condom   Other Topics Concern  . None   Social History Narrative  . None     ROS  Objective: Vitals:   03/26/17 0855  BP: 132/86  Pulse: 72  Resp: 16  Temp: 97.8 F (36.6 C)  TempSrc: Oral  SpO2: 97%  Weight: 221 lb (100.2 kg)  Height: 6\' 3"  (1.905 m)  Body mass index is 27.62 kg/m.   Physical Exam  Constitutional: He appears well-developed and well-nourished.  HENT:  Head: Normocephalic and atraumatic.  Right Ear: External ear normal.  Left Ear: External ear normal.  Nose: Nose normal.  Mouth/Throat: Oropharynx is clear and moist. No oropharyngeal exudate.  TM clear with increased fluid noted behind the TM bilaterally  Eyes: Conjunctivae and EOM are normal.  Cardiovascular: Normal rate, regular rhythm and normal heart sounds.   Pulmonary/Chest: Effort normal and breath sounds normal. No respiratory distress. He has no wheezes.  Psychiatric: He has a normal mood and affect. His behavior is normal. Judgment and thought content normal.    Assessment and Plan Ruben RuizJohn was seen today for loss of smell and medication refill.  Diagnoses and all orders for this visit:  Loss of smell- no neuro deficits Discussed that he should continue antihistamines and allergy treatment  Erectile dysfunction, unspecified erectile dysfunction type -     tadalafil (  CIALIS) 20 MG tablet; TAKE 0.5 - 1 TAB BY MOUTH DAILY AS NEEDED FOR ED.     Everet Flagg A Virda Betters

## 2017-04-01 ENCOUNTER — Telehealth: Payer: Self-pay | Admitting: Family Medicine

## 2017-04-01 NOTE — Telephone Encounter (Signed)
Pt calling to let Dr Eldred MangesStalling know that he has stop taking singular and that was the reason why he had loss his since of smell but he want to know if he can get another medicine since he just stopped taking the singular because he was told that he just cant stop taking that medicine without a replacement please respond

## 2017-04-14 ENCOUNTER — Other Ambulatory Visit: Payer: Self-pay | Admitting: Physician Assistant

## 2017-05-15 DIAGNOSIS — J4530 Mild persistent asthma, uncomplicated: Secondary | ICD-10-CM | POA: Diagnosis not present

## 2017-05-15 DIAGNOSIS — J3081 Allergic rhinitis due to animal (cat) (dog) hair and dander: Secondary | ICD-10-CM | POA: Diagnosis not present

## 2017-05-15 DIAGNOSIS — J301 Allergic rhinitis due to pollen: Secondary | ICD-10-CM | POA: Diagnosis not present

## 2017-05-15 DIAGNOSIS — J3089 Other allergic rhinitis: Secondary | ICD-10-CM | POA: Diagnosis not present

## 2017-06-03 DIAGNOSIS — Z85828 Personal history of other malignant neoplasm of skin: Secondary | ICD-10-CM | POA: Diagnosis not present

## 2017-06-03 DIAGNOSIS — L738 Other specified follicular disorders: Secondary | ICD-10-CM | POA: Diagnosis not present

## 2017-06-03 DIAGNOSIS — L821 Other seborrheic keratosis: Secondary | ICD-10-CM | POA: Diagnosis not present

## 2017-06-03 DIAGNOSIS — D225 Melanocytic nevi of trunk: Secondary | ICD-10-CM | POA: Diagnosis not present

## 2017-07-16 DIAGNOSIS — J301 Allergic rhinitis due to pollen: Secondary | ICD-10-CM | POA: Diagnosis not present

## 2017-07-16 DIAGNOSIS — J4531 Mild persistent asthma with (acute) exacerbation: Secondary | ICD-10-CM | POA: Diagnosis not present

## 2017-07-16 DIAGNOSIS — J4530 Mild persistent asthma, uncomplicated: Secondary | ICD-10-CM | POA: Diagnosis not present

## 2017-07-16 DIAGNOSIS — J3081 Allergic rhinitis due to animal (cat) (dog) hair and dander: Secondary | ICD-10-CM | POA: Diagnosis not present

## 2017-07-19 ENCOUNTER — Other Ambulatory Visit: Payer: Self-pay | Admitting: Family Medicine

## 2017-07-24 ENCOUNTER — Encounter: Payer: Self-pay | Admitting: Urgent Care

## 2017-07-24 ENCOUNTER — Ambulatory Visit (INDEPENDENT_AMBULATORY_CARE_PROVIDER_SITE_OTHER): Payer: BLUE CROSS/BLUE SHIELD | Admitting: Urgent Care

## 2017-07-24 VITALS — BP 136/83 | HR 82 | Temp 97.8°F | Resp 16 | Ht 75.0 in | Wt 206.0 lb

## 2017-07-24 DIAGNOSIS — N529 Male erectile dysfunction, unspecified: Secondary | ICD-10-CM | POA: Diagnosis not present

## 2017-07-24 MED ORDER — SILDENAFIL CITRATE 20 MG PO TABS: 20.0000 mg | ORAL_TABLET | Freq: Every day | ORAL | 0 refills | Status: DC | PRN

## 2017-07-24 NOTE — Patient Instructions (Addendum)
Sildenafil tablets (Revatio) What is this medicine? SILDENAFIL (sil DEN a fil) is used to treat pulmonary arterial hypertension. This is a serious heart and lung condition. This medicine helps to improve symptoms and quality of life. This medicine may be used for other purposes; ask your health care provider or pharmacist if you have questions. COMMON BRAND NAME(S): Revatio What should I tell my health care provider before I take this medicine? They need to know if you have any of these conditions: -anatomical deformation of the penis, Peyronie's disease, or history of priapism (painful and prolonged erection) -bleeding disorders -eye disease, vision problems -heart disease -high or low blood pressure -history of blood diseases, like sickle cell anemia or leukemia -kidney disease -liver disease -pulmonary veno-occlusive disease (PVOD) -stomach ulcer -an unusual or allergic reaction to sildenafil, other medicines, foods, dyes, or preservatives -pregnant or trying to get pregnant -breast-feeding How should I use this medicine? Take this medicine by mouth with a glass of water. Follow the directions on the prescription label. You can take it with or without food. If it upsets your stomach, take it with food. Take your doses at regular intervals about 4 to 6 hours apart. Do not take it more often than directed. Do not stop taking except on your doctor's advice. Talk to your pediatrician regarding the use of this medicine in children. This medicine is not approved for use in children. Overdosage: If you think you have taken too much of this medicine contact a poison control center or emergency room at once. NOTE: This medicine is only for you. Do not share this medicine with others. What if I miss a dose? If you miss a dose, take it as soon as you can. If it is almost time for your next dose, take only that dose. Do not take double or extra doses. What may interact with this medicine? Do not  take this medicine with any of the following medications: -cisapride -cobicistat -nitrates like amyl nitrite, isosorbide dinitrate, isosorbide mononitrate, nitroglycerin -riociguat -telaprevir This medicine may also interact with the following medications: -antiviral medicines for HIV or AIDS -bosentan -certain medicines for benign prostatic hyperplasia (BPH) -certain medicines for blood pressure -certain medicines for fungal infections like ketoconazole and itraconazole -cimetidine -erythromycin -rifampin This list may not describe all possible interactions. Give your health care provider a list of all the medicines, herbs, non-prescription drugs, or dietary supplements you use. Also tell them if you smoke, drink alcohol, or use illegal drugs. Some items may interact with your medicine. What should I watch for while using this medicine? Tell your doctor or healthcare professional if your symptoms do not start to get better or if they get worse. Tell your doctor or health care professional right away if you have any change in your eyesight or hearing. You may get dizzy. Do not drive, use machinery, or do anything that needs mental alertness until you know how this medicine affects you. Do not stand or sit up quickly, especially if you are an older patient. This reduces the risk of dizzy or fainting spells. Avoid alcoholic drinks; they can make you more dizzy. What side effects may I notice from receiving this medicine? Side effects that you should report to your doctor or health care professional as soon as possible: -allergic reactions like skin rash, itching or hives, swelling of the face, lips, or tongue -breathing problems -changes in vision -chest pain -decreased hearing -fast, irregular heartbeat -men: prolonged or painful erection (lasting more than 4  hours) Side effects that usually do not require medical attention (report to your doctor or health care professional if they  continue or are bothersome): -facial flushing -headache -nosebleed -trouble sleeping -upset stomach This list may not describe all possible side effects. Call your doctor for medical advice about side effects. You may report side effects to FDA at 1-800-FDA-1088. Where should I keep my medicine? Keep out of reach of children. Store at room temperature between 15 and 30 degrees C (59 and 86 degrees F). Throw away any unused medicine after the expiration date. NOTE: This sheet is a summary. It may not cover all possible information. If you have questions about this medicine, talk to your doctor, pharmacist, or health care provider.  2018 Elsevier/Gold Standard (2015-10-04 17:18:06)     IF you received an x-ray today, you will receive an invoice from [facility]. Please contact [facility] at [phone] with questions or concerns regarding your invoice.   IF you received labwork today, you will receive an invoice from [facility]. Please contact [facility] at [phone] with questions or concerns regarding your invoice.   Our billing staff will not be able to assist you with questions regarding bills from these companies.  You will be contacted with the lab results as soon as they are available. The fastest way to get your results is to activate your My Chart account. Instructions are located on the last page of this paperwork. If you have not heard from [place] regarding the results in 2 weeks, please contact this office.

## 2017-07-24 NOTE — Progress Notes (Signed)
    MRN: [id number] DOB: [birth year]  Subjective:   Ruben Snyder is a 56 y.o. male presenting for chief complaint of Medication Problem (pt would like to change cialis med to viagra b/c it is cheaper)  Reports ongoing ED. He is successful with Cialis but this is very expensive for patient. He would like to switch to Viagra, a generic form. Denies history of heart disease, HTN, chest pain. Denies smoking cigarettes.   Ruben Snyder has a current medication list which includes the following prescription(s): alprazolam, fluticasone, fluticasone furoate, proair hfa, tadalafil, and montelukast. Also is allergic to nsaids and reglan [metoclopramide].  Ruben Snyder  has a past medical history of Anxiety and Depression. Denies past surgical history.  Objective:   Vitals: BP 136/83   Pulse 82   Temp 97.8 F (36.6 C) (Oral)   Resp 16   Ht  (1.905 m)   Wt 206 lb (93.4 kg)   SpO2 96%   BMI 25.75 kg/m   Physical Exam  Constitutional: He is oriented to person, place, and time. He appears well-developed and well-nourished.  Cardiovascular: Normal rate.   Pulmonary/Chest: Effort normal.  Neurological: He is alert and oriented to person, place, and time.  Psychiatric: He has a normal mood and affect.   Assessment and Plan :   1. Erectile dysfunction, unspecified erectile dysfunction type - Counseled on use of sildenafil. Counseled patient on potential for adverse effects with medications prescribed today, patient verbalized understanding. We will start a trial of sildenafil. Patient will let me know if it works and we can switch back to Cialis if necessary. Otherwise, f/u in 4 weeks.  Wallis Bamberg, PA-C Primary Care at [facility] [phone] 07/24/2017  4:14 PM

## 2017-08-21 ENCOUNTER — Ambulatory Visit: Payer: BLUE CROSS/BLUE SHIELD | Admitting: Family Medicine

## 2017-09-22 ENCOUNTER — Ambulatory Visit (INDEPENDENT_AMBULATORY_CARE_PROVIDER_SITE_OTHER): Payer: BLUE CROSS/BLUE SHIELD

## 2017-09-22 ENCOUNTER — Ambulatory Visit: Payer: BLUE CROSS/BLUE SHIELD | Admitting: Physician Assistant

## 2017-09-22 ENCOUNTER — Other Ambulatory Visit: Payer: Self-pay

## 2017-09-22 ENCOUNTER — Encounter: Payer: Self-pay | Admitting: Physician Assistant

## 2017-09-22 VITALS — BP 116/64 | HR 81 | Temp 97.9°F | Resp 16 | Ht 75.0 in | Wt 206.6 lb

## 2017-09-22 DIAGNOSIS — M25522 Pain in left elbow: Secondary | ICD-10-CM | POA: Diagnosis not present

## 2017-09-22 DIAGNOSIS — Z1211 Encounter for screening for malignant neoplasm of colon: Secondary | ICD-10-CM

## 2017-09-22 LAB — POCT CBC
GRANULOCYTE PERCENT: 72.1 % (ref 37–80)
HCT, POC: 45.4 % (ref 43.5–53.7)
Hemoglobin: 15.2 g/dL (ref 14.1–18.1)
Lymph, poc: 2 (ref 0.6–3.4)
MCH, POC: 32.3 pg — AB (ref 27–31.2)
MCHC: 33.6 g/dL (ref 31.8–35.4)
MCV: 96.2 fL (ref 80–97)
MID (CBC): 0.6 (ref 0–0.9)
MPV: 6.9 fL (ref 0–99.8)
PLATELET COUNT, POC: 201 10*3/uL (ref 142–424)
POC Granulocyte: 6.6 (ref 2–6.9)
POC LYMPH %: 21.8 % (ref 10–50)
POC MID %: 6.1 % (ref 0–12)
RBC: 4.72 M/uL (ref 4.69–6.13)
RDW, POC: 13.7 %
WBC: 9.2 10*3/uL (ref 4.6–10.2)

## 2017-09-22 MED ORDER — DOXYCYCLINE HYCLATE 100 MG PO CAPS: 100.0000 mg | ORAL_CAPSULE | Freq: Two times a day (BID) | ORAL | 0 refills | Status: DC

## 2017-09-22 MED ORDER — CEFTRIAXONE SODIUM 1 G IJ SOLR
1.0000 g | Freq: Once | INTRAMUSCULAR | Status: AC
  Administered 2017-09-22: 1 g via INTRAMUSCULAR

## 2017-09-22 NOTE — Progress Notes (Deleted)
Subjective:    Ruben Snyder is a 56 y.o. male who presents with {right/left/bi:30031} elbow pain. Onset of the symptoms was {onset:14048}. Inciting event: {elbow inciting events:17817}. Current symptoms include: {elbow pain symptoms:12877}. Pain is aggravated by: {pain aggravating factors:14248}. Symptoms have {clinical course:19445}. Patient {has had:32492} prior elbow problems. Evaluation to date: {eval to date:14106}. Treatment to date: {treatment:14351}.  {Common ambulatory SmartLinks:19316}  Review of Systems {ros - complete:30496}   Objective:    BP 116/64 (BP Location: Left Arm, Patient Position: Sitting, Cuff Size: Large)   Pulse 81   Temp 97.9 F (36.6 C) (Oral)   Resp 16   Ht 6\' 3"  (1.905 m)   Wt 206 lb 9.6 oz (93.7 kg)   SpO2 97%   BMI 25.82 kg/m  Right elbow: {exam; elbow:14251}  Left elbow:  {exam; elbow:14251}   X-ray {elbow; right/left/both:19203}: {findings; x-rayl:5769::"no fracture, dislocation, swelling or degenerative changes noted"}   Assessment:    {right/left/bit:30031} {elbow diagnosis list:14253}    Plan:    {elbow plan:14254}

## 2017-09-22 NOTE — Patient Instructions (Addendum)
Your history and physical exam findings are consistent with mild septic bursitis. You received one shot in clinic today and will also be placed on oral antibiotics for the next 10 days. Please apply ice to the affected area 4-5 x a day for swelling and pain relief. I would like you to return to office in 2 days for reevaluation. Return sooner if symptoms worsen. Thank you for letting me participate in your health and well being.  While taking Doxycycline:  -Do not drink milk or take iron supplements, multivitamins, calcium supplements, antacids, laxatives within 2 hours before or after taking doxycycline. -Avoid direct exposure to sunlight or tanning beds. Doxycycline can make you sunburn more easily. Wear protective clothing and use sunscreen (SPF 30 or higher) when you are outdoors. -Antibiotic medicines can cause diarrhea, which may be a sign of a new infection. If you have diarrhea that is watery or bloody, stop taking this medicine and seek medical care.     Elbow Bursitis A bursa is a fluid-filled sac that covers and protects a joint. Bursitis is when the fluid-filled sac gets puffy and sore (inflamed). Elbow bursitis, also called olecranon bursitis, happens over your elbow. This may be caused by:  Injury (acute trauma) to your elbow.  Leaning on hard surfaces for long periods of time.  Infection from an injury that breaks the skin near your elbow.  A bone growth (spur) that forms at the tip of your elbow.  A medical condition that causes inflammation in your body, such as: ? Gout. ? Rheumatoid arthritis.  Sometimes the cause is not known. Follow these instructions at home:  Take medicines only as told by your doctor.  If you were prescribed an antibiotic medicine, finish all of it even if you start to feel better.  If your bursitis is caused by an injury, rest your elbow and wear your bandage as told by your doctor. You may also apply ice to the injured area as told by your  doctor: ? Put ice in a plastic bag. ? Place a towel between your skin and the bag. ? Leave the ice on for 20 minutes, 2-3 times per day.  Do not do any activities that cause pain to your elbow.  Use elbow pads or wraps to cushion your elbow. Contact a doctor if:  You have a fever.  Your symptoms do not get better with treatment.  Your pain or swelling gets worse.  Your pain or swelling goes away and then comes back.  You have drainage of pus from the swollen area over your elbow. This information is not intended to replace advice given to you by your health care provider. Make sure you discuss any questions you have with your health care provider. Document Released: 04/10/2010 Document Revised: 03/28/2016 Document Reviewed: 06/29/2014 Elsevier Interactive Patient Education  2018 [employer]     IF you received an x-ray today, you will receive an invoice from [facility]. Please contact [facility] at [phone] with questions or concerns regarding your invoice.   IF you received labwork today, you will receive an invoice from [facility]. Please contact [facility] at [phone] with questions or concerns regarding your invoice.   Our billing staff will not be able to assist you with questions regarding bills from these companies.  You will be contacted with the lab results as soon as they are available. The fastest way to get your results is to activate your My Chart account. Instructions are located on the last  page of this paperwork. If you have not heard from [place] regarding the results in 2 weeks, please contact this office.

## 2017-09-22 NOTE — Progress Notes (Signed)
Ruben Snyder  MRN: [id number] DOB: 05/30/1961  Subjective:   Ruben Snyder is a 56 y.o. male who presents with left elbow pain. Onset of the symptoms was yesterday. Notes he was working on a project yesterday on the computer and spent a great deal of time leaning on his left elbow at a table.  Denies other acute injury or insect exposure. Current associated symptoms include: redness, swelling, and warmth. He still has full ROM of the elbow joint and denies decreased strength, numbness, and tingling.  Pain is aggravated by: moving arm too much.  Patient has had no prior elbow problems. Evaluation to date: none. Treatment to date: none.  Former smoker.  No history of diabetes.  Review of Systems  Constitutional: Negative for chills, diaphoresis and fever.  Gastrointestinal: Negative for nausea and vomiting.    Patient Active Problem List   Diagnosis Date Noted  . Bronchospasm with bronchitis, acute 02/04/2017  . Anxiety 05/26/2013  . Impotence 05/26/2013    Current Outpatient Medications on File Prior to Visit  Medication Sig Dispense Refill  . ALPRAZolam (XANAX) 1 MG tablet Take 1 tablet (1 mg total) by mouth 2 (two) times daily as needed for anxiety. 20 tablet 1  . fluticasone (FLONASE) 50 MCG/ACT nasal spray PLACE 2 SPRAYS INTO BOTH NOSTRILS DAILY. 16 g 3  . Fluticasone Furoate (ARNUITY ELLIPTA) 100 MCG/ACT AEPB Inhale into the lungs.    Marland Kitchen. PROAIR HFA 108 (90 Base) MCG/ACT inhaler INHALE 2 PUFFS INTO THE LUNGS EVERY 6 (SIX) HOURS AS NEEDED. 8.5 Inhaler 1  . montelukast (SINGULAIR) 10 MG tablet Take 1 tablet (10 mg total) by mouth at bedtime. (Patient not taking: Reported on 09/22/2017) 90 tablet 3  . sildenafil (REVATIO) 20 MG tablet Take 1-3 tablets (20-60 mg total) by mouth daily as needed. Take medication 1 hour prior to sex. (Patient not taking: Reported on 09/22/2017) 30 tablet 0   No current facility-administered medications on file prior to visit.     Allergies    Allergen Reactions  . Nsaids   . Reglan [Metoclopramide]       Social History   Socioeconomic History  . Marital status: Divorced    Spouse name: Not on file  . Number of children: Not on file  . Years of education: Not on file  . Highest education level: Not on file  Social Needs  . Financial resource strain: Not on file  . Food insecurity - worry: Not on file  . Food insecurity - inability: Not on file  . Transportation needs - medical: Not on file  . Transportation needs - non-medical: Not on file  Occupational History  . Not on file  Tobacco Use  . Smoking status: Former Smoker    Types: Cigarettes  . Smokeless tobacco: Never Used  Substance and Sexual Activity  . Alcohol use: Yes  . Drug use: No  . Sexual activity: Yes    Birth control/protection: Condom  Other Topics Concern  . Not on file  Social History Narrative  . Not on file    Objective:  BP 116/64 (BP Location: Left Arm, Patient Position: Sitting, Cuff Size: Large)   Pulse 81   Temp 97.9 F (36.6 C) (Oral)   Resp 16   Ht 6\' 3"  (1.905 m)   Wt 206 lb 9.6 oz (93.7 kg)   SpO2 97%   BMI 25.82 kg/m   Physical Exam  Constitutional: He is oriented to person, place, and time and well-developed,  well-nourished, and in no distress.  HENT:  Head: Normocephalic and atraumatic.  Eyes: Conjunctivae are normal.  Neck: Normal range of motion.  Pulmonary/Chest: Effort normal.  Musculoskeletal:       Left elbow: He exhibits normal range of motion.  Neurological: He is alert and oriented to person, place, and time. Gait normal.  Muscular strength 5/5 in upper extremities bilaterally.  Skin: Skin is warm and dry.     Psychiatric: Affect normal.  Vitals reviewed.    Dg Elbow Complete Left (3+view)  Result Date: 09/22/2017 CLINICAL DATA:  Left elbow pain with redness and warmth. EXAM: LEFT ELBOW - COMPLETE 3+ VIEW COMPARISON:  None. FINDINGS: There is no evidence of fracture, dislocation, or joint  effusion. There is no evidence of arthropathy or other focal bone abnormality. There is soft tissue swelling over the posterior aspect of the elbow. Tiny calcification in the distal triceps tendon. IMPRESSION: Possible olecranon bursitis. Electronically Signed   By: Francene BoyersJames  Maxwell M.D.   On: 09/22/2017 13:01    Results for orders placed or performed in visit on 09/22/17 (from the past 24 hour(s))  POCT CBC     Status: Abnormal   Collection Time: 09/22/17  1:10 PM  Result Value Ref Range   WBC 9.2 4.6 - 10.2 K/uL   Lymph, poc 2.0 0.6 - 3.4   POC LYMPH PERCENT 21.8 10 - 50 %L   MID (cbc) 0.6 0 - 0.9   POC MID % 6.1 0 - 12 %M   POC Granulocyte 6.6 2 - 6.9   Granulocyte percent 72.1 37 - 80 %G   RBC 4.72 4.69 - 6.13 M/uL   Hemoglobin 15.2 14.1 - 18.1 g/dL   HCT, POC 16.145.4 09.643.5 - 53.7 %   MCV 96.2 80 - 97 fL   MCH, POC 32.3 (A) 27 - 31.2 pg   MCHC 33.6 31.8 - 35.4 g/dL   RDW, POC 04.513.7 %   Platelet Count, POC 201 142 - 424 K/uL   MPV 6.9 0 - 99.8 fL    Assessment and Plan :  1. Left elbow pain History, physical exam findings, and plain films consistent with beginning septic bursitis.  Will treat with IM injection of Rocephin and oral doxycycline today.  He is afebrile and his white blood cell count is 9.2 which is reassuring.  Erythema was outlined with surgical marker.  Plan to follow-up in 2 days in office for reevaluation.  Encouraged to return sooner if symptoms worsen or he develops new concerning symptoms. - DG ELBOW COMPLETE LEFT (3+VIEW); Future - POCT CBC - cefTRIAXone (ROCEPHIN) injection 1 g - doxycycline (VIBRAMYCIN) 100 MG capsule; Take 1 capsule (100 mg total) 2 (two) times daily by mouth.  Dispense: 20 capsule; Refill: 0  2. Screening for colon cancer - Ambulatory referral to Gastroenterology  Benjiman CoreBrittany Tejay Hubert PA-C  [facility]  [facility] 09/22/2017 1:13 PM

## 2017-09-24 ENCOUNTER — Encounter: Payer: Self-pay | Admitting: Family Medicine

## 2017-09-24 ENCOUNTER — Ambulatory Visit: Payer: BLUE CROSS/BLUE SHIELD | Admitting: Family Medicine

## 2017-09-24 ENCOUNTER — Other Ambulatory Visit: Payer: Self-pay

## 2017-09-24 VITALS — BP 90/80 | HR 97 | Temp 97.9°F | Wt 207.4 lb

## 2017-09-24 DIAGNOSIS — M25522 Pain in left elbow: Secondary | ICD-10-CM | POA: Diagnosis not present

## 2017-09-24 MED ORDER — TADALAFIL 20 MG PO TABS: 20.0000 mg | ORAL_TABLET | Freq: Every day | ORAL | 11 refills | Status: DC | PRN

## 2017-09-24 NOTE — Patient Instructions (Addendum)
     IF you received an x-ray today, you will receive an invoice from [facility]. Please contact [facility] at [phone] with questions or concerns regarding your invoice.   IF you received labwork today, you will receive an invoice from [facility]. Please contact [facility] at [phone] with questions or concerns regarding your invoice.   Our billing staff will not be able to assist you with questions regarding bills from these companies.  You will be contacted with the lab results as soon as they are available. The fastest way to get your results is to activate your My Chart account. Instructions are located on the last page of this paperwork. If you have not heard from us regarding the results in 2 weeks, please contact this office.    Joint Pain Joint pain, which is also called arthralgia, can be caused by many things. Joint pain often goes away when you follow your health care provider's instructions for relieving pain at home. However, joint pain can also be caused by conditions that require further treatment. Common causes of joint pain include:  Bruising in the area of the joint.  Overuse of the joint.  Wear and tear on the joints that occur with aging (osteoarthritis).  Various other forms of arthritis.  A buildup of a crystal form of uric acid in the joint (gout).  Infections of the joint (septic arthritis) or of the bone (osteomyelitis).  Your health care provider may recommend medicine to help with the pain. If your joint pain continues, additional tests may be needed to diagnose your condition. Follow these instructions at home: Watch your condition for any changes. Follow these instructions as directed to lessen the pain that you are feeling.  Take medicines only as directed by your health care provider.  Rest the affected area for as long as your health care provider says that you should. If directed to do so, raise the painful joint above the  level of your heart while you are sitting or lying down.  Do not do things that cause or worsen pain.  If directed, apply ice to the painful area: ? Put ice in a plastic bag. ? Place a towel between your skin and the bag. ? Leave the ice on for 20 minutes, 2-3 times per day.  Wear an elastic bandage, splint, or sling as directed by your health care provider. Loosen the elastic bandage or splint if your fingers or toes become numb and tingle, or if they turn cold and blue.  Begin exercising or stretching the affected area as directed by your health care provider. Ask your health care provider what types of exercise are safe for you.  Keep all follow-up visits as directed by your health care provider. This is important.  Contact a health care provider if:  Your pain increases, and medicine does not help.  Your joint pain does not improve within 3 days.  You have increased bruising or swelling.  You have a fever.  You lose 10 lb (4.5 kg) or more without trying. Get help right away if:  You are not able to move the joint.  Your fingers or toes become numb or they turn cold and blue. This information is not intended to replace advice given to you by your health care provider. Make sure you discuss any questions you have with your health care provider. Document Released: 10/21/2005 Document Revised: 03/22/2016 Document Reviewed: 08/02/2014 Elsevier Interactive Patient Education  2018 Elsevier Inc.  

## 2017-09-24 NOTE — Progress Notes (Signed)
Chief Complaint  Patient presents with  . Elbow Pain    left elbow    HPI Pt saw [place] Wis[place] PA-C on 09/22/2017 He reports that he is taking abx doxycycline He reports that he has felt like it is better so far  He denies fever  Reports that he has a habit of resting his elbow   4 review of systems  Past Medical History:  Diagnosis Date  . Anxiety     Current Outpatient Medications  Medication Sig Dispense Refill  . ALPRAZolam (XANAX) 1 MG tablet Take 1 tablet (1 mg total) by mouth 2 (two) times daily as needed for anxiety. 20 tablet 1  . doxycycline (VIBRAMYCIN) 100 MG capsule Take 1 capsule (100 mg total) 2 (two) times daily by mouth. 20 capsule 0  . fluticasone (FLONASE) 50 MCG/ACT nasal spray PLACE 2 SPRAYS INTO BOTH NOSTRILS DAILY. 16 g 3  . Fluticasone Furoate (ARNUITY ELLIPTA) 100 MCG/ACT AEPB Inhale into the lungs.    Marland Kitchen. PROAIR HFA 108 (90 Base) MCG/ACT inhaler INHALE 2 PUFFS INTO THE LUNGS EVERY 6 (SIX) HOURS AS NEEDED. 8.5 Inhaler 1  . montelukast (SINGULAIR) 10 MG tablet Take 1 tablet (10 mg total) by mouth at bedtime. (Patient not taking: Reported on 09/22/2017) 90 tablet 3  . tadalafil (CIALIS) 20 MG tablet Take 1 tablet (20 mg total) by mouth daily as needed for erectile dysfunction. 10 tablet 11   No current facility-administered medications for this visit.     Allergies:  Allergies  Allergen Reactions  . Reglan [Metoclopramide]   . Nsaids     hives    Past Surgical History:  Procedure Laterality Date  . KNEE SURGERY Left    age 56    Social History   Socioeconomic History  . Marital status: Divorced    Spouse name: None  . Number of children: None  . Years of education: None  . Highest education level: None  Social Needs  . Financial resource strain: None  . Food insecurity - worry: None  . Food insecurity - inability: None  . Transportation needs - medical: None  . Transportation needs - non-medical: None  Occupational History  .  None  Tobacco Use  . Smoking status: Former Games developermoker  . Smokeless tobacco: Former Engineer, waterUser  Substance and Sexual Activity  . Alcohol use: Yes    Comment: occasionally  . Drug use: No  . Sexual activity: Yes    Birth control/protection: Condom  Other Topics Concern  . None  Social History Narrative  . None    History reviewed. No pertinent family history.   ROS Review of Systems See HPI Constitution: No fevers or chills No malaise No diaphoresis Skin: No rash or itching Eyes: no blurry vision, no double vision GU: no dysuria or hematuria Neuro: no dizziness or headaches    Objective: Vitals:   09/24/17 1421  BP: 90/80  Pulse: 97  Temp: 97.9 F (36.6 C)  TempSrc: Oral  SpO2: 97%  Weight: 207 lb 6.4 oz (94.1 kg)    Physical Exam  Constitutional: He is oriented to person, place, and time. He appears well-developed and well-nourished.  HENT:  Head: Normocephalic and atraumatic.  Pulmonary/Chest: Effort normal.  Neurological: He is alert and oriented to person, place, and time.  Skin:  Left elbow with reduced fluctuance, effusion and erythema  Psychiatric: He has a normal mood and affect. His behavior is normal. Judgment and thought content normal.    Assessment and Plan [name]  was seen today for elbow pain.  Diagnoses and all orders for this visit:  Left elbow pain- improved with doxycycline  Other orders -     tadalafil (CIALIS) 20 MG tablet; Take 1 tablet (20 mg total) by mouth daily as needed for erectile dysfunction.     Krystn Dermody A Pete Merten

## 2017-10-08 ENCOUNTER — Other Ambulatory Visit: Payer: Self-pay

## 2017-10-08 ENCOUNTER — Encounter: Payer: Self-pay | Admitting: Urgent Care

## 2017-10-08 ENCOUNTER — Ambulatory Visit: Payer: BLUE CROSS/BLUE SHIELD | Admitting: Urgent Care

## 2017-10-08 VITALS — BP 118/76 | HR 74 | Temp 98.1°F | Resp 16 | Ht 75.0 in | Wt 207.6 lb

## 2017-10-08 DIAGNOSIS — H109 Unspecified conjunctivitis: Secondary | ICD-10-CM

## 2017-10-08 DIAGNOSIS — H5789 Other specified disorders of eye and adnexa: Secondary | ICD-10-CM | POA: Diagnosis not present

## 2017-10-08 MED ORDER — ERYTHROMYCIN 5 MG/GM OP OINT: 1.0000 "application " | TOPICAL_OINTMENT | Freq: Four times a day (QID) | OPHTHALMIC | 0 refills | Status: DC

## 2017-10-08 NOTE — Patient Instructions (Addendum)
Bacterial Conjunctivitis Bacterial conjunctivitis is an infection of the clear membrane that covers the white part of your eye and the inner surface of your eyelid (conjunctiva). When the blood vessels in your conjunctiva become inflamed, your eye becomes red or pink, and it will probably feel itchy. Bacterial conjunctivitis spreads very easily from person to person (is contagious). It also spreads easily from one eye to the other eye. What are the causes? This condition is caused by several common bacteria. You may get the infection if you come into close contact with another person who is infected. You may also come into contact with items that are contaminated with the bacteria, such as a face towel, contact lens solution, or eye makeup. What increases the risk? This condition is more likely to develop in people who:  Are exposed to other people who have the infection.  Wear contact lenses.  Have a sinus infection.  Have had a recent eye injury or surgery.  Have a weak body defense system (immune system).  Have a medical condition that causes dry eyes.  What are the signs or symptoms? Symptoms of this condition include:  Eye redness.  Tearing or watery eyes.  Itchy eyes.  Burning feeling in your eyes.  Thick, yellowish discharge from an eye. This may turn into a crust on the eyelid overnight and cause your eyelids to stick together.  Swollen eyelids.  Blurred vision.  How is this diagnosed? Your health care provider can diagnose this condition based on your symptoms and medical history. Your health care provider may also take a sample of discharge from your eye to find the cause of your infection. This is rarely done. How is this treated? Treatment for this condition includes:  Antibiotic eye drops or ointment to clear the infection more quickly and prevent the spread of infection to others.  Oral antibiotic medicines to treat infections that do not respond to drops or  ointments, or last longer than 10 days.  Cool, wet cloths (cool compresses) placed on the eyes.  Artificial tears applied 2-6 times a day.  Follow these instructions at home: Medicines  Take or apply your antibiotic medicine as told by your health care provider. Do not stop taking or applying the antibiotic even if you start to feel better.  Take or apply over-the-counter and prescription medicines only as told by your health care provider.  Be very careful to avoid touching the edge of your eyelid with the eye drop bottle or the ointment tube when you apply medicines to the affected eye. This will keep you from spreading the infection to your other eye or to other people. Managing discomfort  Gently wipe away any drainage from your eye with a warm, wet washcloth or a cotton ball.  Apply a cool, clean washcloth to your eye for 10-20 minutes, 3-4 times a day. General instructions  Do not wear contact lenses until the inflammation is gone and your health care provider says it is safe to wear them again. Ask your health care provider how to sterilize or replace your contact lenses before you use them again. Wear glasses until you can resume wearing contacts.  Avoid wearing eye makeup until the inflammation is gone. Throw away any old eye cosmetics that may be contaminated.  Change or wash your pillowcase every day.  Do not share towels or washcloths. This may spread the infection.  Wash your hands often with soap and water. Use paper towels to dry your hands.  Avoid   touching or rubbing your eyes.  Do not drive or use heavy machinery if your vision is blurred. Contact a health care provider if:  You have a fever.  Your symptoms do not get better after 10 days. Get help right away if:  You have a fever and your symptoms suddenly get worse.  You have severe pain when you move your eye.  You have facial pain, redness, or swelling.  You have sudden loss of vision. This  information is not intended to replace advice given to you by your health care provider. Make sure you discuss any questions you have with your health care provider. Document Released: 10/21/2005 Document Revised: 02/29/2016 Document Reviewed: 08/03/2015 Elsevier Interactive Patient Education  2017 Elsevier Inc.     IF you received an x-ray today, you will receive an invoice from [facility]. Please contact [facility] at [phone] with questions or concerns regarding your invoice.   IF you received labwork today, you will receive an invoice from [facility]. Please contact [facility] at [phone] with questions or concerns regarding your invoice.   Our billing staff will not be able to assist you with questions regarding bills from these companies.  You will be contacted with the lab results as soon as they are available. The fastest way to get your results is to activate your My Chart account. Instructions are located on the last page of this paperwork. If you have not heard from us regarding the results in 2 weeks, please contact this office.     

## 2017-10-08 NOTE — Progress Notes (Signed)
  MRN: [id number] DOB: 10-May-1961  Subjective:   Ruben Snyder is a 56 y.o. male presenting for 5 day history of left eye redness, mild drainage, mild pain, itching, feels like he could have a stye. Denies fever, eyelid swelling, pain with eye movement, photosensitivity, trauma.  Shogo has a current medication list which includes the following prescription(s): alprazolam, fluticasone, fluticasone furoate, montelukast, proair hfa, and tadalafil. Also is allergic to reglan [metoclopramide] and nsaids.  Ruben Snyder  has a past medical history of Anxiety. Also  has a past surgical history that includes Knee surgery (Left).  Objective:   Vitals: BP 118/76   Pulse 74   Temp 98.1 F (36.7 C) (Oral)   Resp 16   Ht 6\' 3"  (1.905 m)   Wt 207 lb 9.6 oz (94.2 kg)   SpO2 96%   BMI 25.95 kg/m    Visual Acuity Screening   Right eye Left eye Both eyes  Without correction:     With correction: 20/13 20/50-1 20/13   Physical Exam  Constitutional: He is oriented to person, place, and time. He appears well-developed and well-nourished.  Eyes: EOM are normal. Pupils are equal, round, and reactive to light. Right eye exhibits no chemosis, no discharge, no exudate and no hordeolum. No foreign body present in the right eye. Left eye exhibits discharge (clear-whitish). Left eye exhibits no chemosis, no exudate and no hordeolum. No foreign body present in the left eye. Right conjunctiva is not injected. Right conjunctiva has no hemorrhage. Left conjunctiva is injected. Left conjunctiva has no hemorrhage. No scleral icterus.  Cardiovascular: Normal rate.  Pulmonary/Chest: Effort normal.  Neurological: He is alert and oriented to person, place, and time.   Assessment and Plan :   1. Bacterial conjunctivitis of left eye 2. Redness of left eye - Start erythromycin ointment QID. Return-to-clinic precautions discussed, patient verbalized understanding. Follow up in 2 days.   Ruben BambergMario [employer], PA-C Primary Care at  [facility] [phone] 10/08/2017  10:47 AM

## 2017-10-10 ENCOUNTER — Other Ambulatory Visit: Payer: Self-pay

## 2017-10-10 ENCOUNTER — Ambulatory Visit: Payer: BLUE CROSS/BLUE SHIELD | Admitting: Physician Assistant

## 2017-10-10 ENCOUNTER — Encounter: Payer: Self-pay | Admitting: Physician Assistant

## 2017-10-10 VITALS — BP 116/80 | HR 70 | Resp 16 | Ht 75.0 in | Wt 207.6 lb

## 2017-10-10 DIAGNOSIS — H109 Unspecified conjunctivitis: Secondary | ICD-10-CM | POA: Diagnosis not present

## 2017-10-10 DIAGNOSIS — H5789 Other specified disorders of eye and adnexa: Secondary | ICD-10-CM

## 2017-10-10 MED ORDER — ERYTHROMYCIN 5 MG/GM OP OINT: 1.0000 "application " | TOPICAL_OINTMENT | Freq: Four times a day (QID) | OPHTHALMIC | 0 refills | Status: DC

## 2017-10-10 NOTE — Patient Instructions (Addendum)
  If not continuing to improve in three days then please call [facility] and tell them that Ruben BostonMichael Snyder [facility] wants you to be seen.    IF you received an x-ray today, you will receive an invoice from [facility]. Please contact [facility] at [phone] with questions or concerns regarding your invoice.   IF you received labwork today, you will receive an invoice from [facility]. Please contact [facility] at [phone] with questions or concerns regarding your invoice.   Our billing staff will not be able to assist you with questions regarding bills from these companies.  You will be contacted with the lab results as soon as they are available. The fastest way to get your results is to activate your My Chart account. Instructions are located on the last page of this paperwork. If you have not heard from us regarding the results in 2 weeks, please contact this office.

## 2017-10-10 NOTE — Addendum Note (Signed)
Addended by: Ofilia NeasLARK, Mayes Sangiovanni L on: 10/10/2017 10:59 AM   Modules accepted: Level of Service

## 2017-10-10 NOTE — Progress Notes (Signed)
    10/10/2017 10:54 AM   DOB: December 19, 1960 / MRN: [id number]  SUBJECTIVE:  Lorelee NewJohn D Andrades is a 56 y.o. male presenting for   He is allergic to reglan [metoclopramide] and nsaids.   He  has a past medical history of Anxiety.    He  reports that he has quit smoking. He has quit using smokeless tobacco. He reports that he drinks alcohol. He reports that he does not use drugs. He  reports that he currently engages in sexual activity. He reports using the following method of birth control/protection: Condom. The patient  has a past surgical history that includes Knee surgery (Left).  His family history is not on file.  Review of Systems  Constitutional: Negative for fever.  Eyes: Positive for discharge and redness. Negative for blurred vision, double vision, photophobia and pain.  Gastrointestinal: Negative for nausea.  Skin: Negative for itching and rash.  Neurological: Negative for dizziness.    The problem list and medications were reviewed and updated by myself where necessary and exist elsewhere in the encounter.   OBJECTIVE:  BP 116/80 (BP Location: Right Arm, Patient Position: Sitting, Cuff Size: Large)   Pulse 70   Resp 16   Ht 6\' 3"  (1.905 m)   Wt 207 lb 9.6 oz (94.2 kg)   SpO2 98%   BMI 25.95 kg/m     Physical Exam  Constitutional: He is oriented to person, place, and time.  Eyes: EOM and lids are normal. Pupils are equal, round, and reactive to light. Left conjunctiva is injected. Left conjunctiva has no hemorrhage.  Fundoscopic exam:      The left eye shows no exudate. The left eye shows red reflex.  Slit lamp exam:      The left eye shows no corneal abrasion, no corneal flare, no corneal ulcer, no foreign body, no hyphema and no hypopyon.  Cardiovascular: Normal rate.  Pulmonary/Chest: Effort normal.  Musculoskeletal: Normal range of motion.  Neurological: He is alert and oriented to person, place, and time. No cranial nerve deficit.    No results found for  this or any previous visit (from the past 72 hour(s)).  No results found.  ASSESSMENT AND PLAN:  [name] was seen today for follow-up.  Diagnoses and all orders for this visit:  Bacterial conjunctivitis of left eye: He is improving. Normal stain of the eye.  Duration somewhat long but he has no other red flags.  More ointment prescribed should he run out.  He will call Dr. Dione BoozeGroat to be seen on Monday if he is not continuing to improve.   Redness of left eye: See above    The patient is advised to call or return to clinic if he does not see an improvement in symptoms, or to seek the care of the closest emergency department if he worsens with the above plan.   Deliah BostonMichael Atom Solivan, MHS, PA-C Primary Care at [facility] 10/10/2017 10:54 AM

## 2017-10-12 ENCOUNTER — Other Ambulatory Visit: Payer: Self-pay | Admitting: Family Medicine

## 2017-10-13 ENCOUNTER — Emergency Department (HOSPITAL_COMMUNITY)
Admission: EM | Admit: 2017-10-13 | Discharge: 2017-10-13 | Disposition: A | Discharge status: Home / Self Care | Payer: BLUE CROSS/BLUE SHIELD | Attending: Emergency Medicine | Admitting: Emergency Medicine

## 2017-10-13 ENCOUNTER — Encounter (HOSPITAL_COMMUNITY): Payer: Self-pay

## 2017-10-13 ENCOUNTER — Ambulatory Visit: Payer: Self-pay | Admitting: *Deleted

## 2017-10-13 DIAGNOSIS — J45909 Unspecified asthma, uncomplicated: Secondary | ICD-10-CM | POA: Insufficient documentation

## 2017-10-13 DIAGNOSIS — Z79899 Other long term (current) drug therapy: Secondary | ICD-10-CM | POA: Diagnosis not present

## 2017-10-13 DIAGNOSIS — H538 Other visual disturbances: Secondary | ICD-10-CM | POA: Diagnosis not present

## 2017-10-13 DIAGNOSIS — Z87891 Personal history of nicotine dependence: Secondary | ICD-10-CM | POA: Diagnosis not present

## 2017-10-13 HISTORY — DX: Male erectile dysfunction, unspecified: N52.9

## 2017-10-13 HISTORY — DX: Unspecified asthma, uncomplicated: J45.909

## 2017-10-13 MED ORDER — TETRACAINE HCL 0.5 % OP SOLN
2.0000 [drp] | Freq: Once | OPHTHALMIC | Status: AC
Start: 2017-10-13 — End: 2017-10-13
  Administered 2017-10-13: 2 [drp] via OPHTHALMIC
  Filled 2017-10-13: qty 4

## 2017-10-13 MED ORDER — FLUORESCEIN SODIUM 1 MG OP STRP
1.0000 | ORAL_STRIP | Freq: Once | OPHTHALMIC | Status: AC
  Administered 2017-10-13: 1 via OPHTHALMIC
  Filled 2017-10-13: qty 1
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
## 2017-10-13 NOTE — ED Notes (Signed)
Bed: WA04 Expected date:  Expected time:  Means of arrival:  Comments: 

## 2017-10-13 NOTE — Discharge Instructions (Signed)
Call to schedule an appointment as we discussed.

## 2017-10-13 NOTE — ED Notes (Signed)
ED Provider at bedside. 

## 2017-10-13 NOTE — ED Triage Notes (Signed)
Patient reports he was diagnosed with conjunctivitis 6 days ago and has been on Erythromycin with no improvement. Patient is now having double vision and blurred vision.

## 2017-10-13 NOTE — ED Notes (Signed)
Bed: WA05 Expected date:  Expected time:  Means of arrival:  Comments: 
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
## 2017-10-14 DIAGNOSIS — H3589 Other specified retinal disorders: Secondary | ICD-10-CM | POA: Diagnosis not present

## 2017-10-21 DIAGNOSIS — H3589 Other specified retinal disorders: Secondary | ICD-10-CM | POA: Diagnosis not present

## 2017-10-22 ENCOUNTER — Encounter: Payer: Self-pay | Admitting: Internal Medicine

## 2017-11-09 ENCOUNTER — Other Ambulatory Visit: Payer: Self-pay | Admitting: Family Medicine

## 2017-11-17 ENCOUNTER — Other Ambulatory Visit: Payer: Self-pay

## 2017-11-17 ENCOUNTER — Encounter: Payer: Self-pay | Admitting: Physician Assistant

## 2017-11-17 ENCOUNTER — Ambulatory Visit: Payer: BLUE CROSS/BLUE SHIELD | Admitting: Physician Assistant

## 2017-11-17 VITALS — BP 126/84 | HR 69 | Resp 16 | Ht 75.0 in | Wt 219.2 lb

## 2017-11-17 DIAGNOSIS — H10402 Unspecified chronic conjunctivitis, left eye: Secondary | ICD-10-CM | POA: Diagnosis not present

## 2017-11-17 DIAGNOSIS — Z7251 High risk heterosexual behavior: Secondary | ICD-10-CM | POA: Diagnosis not present

## 2017-11-17 MED ORDER — AZITHROMYCIN 250 MG PO TABS: ORAL_TABLET | ORAL | 0 refills | Status: DC

## 2017-11-17 MED ORDER — DOXYCYCLINE HYCLATE 100 MG PO CAPS: 100.0000 mg | ORAL_CAPSULE | Freq: Two times a day (BID) | ORAL | 0 refills | Status: AC

## 2017-11-17 NOTE — Patient Instructions (Addendum)
It was good to see you again today during.  I hope today helps.  If you are getting worse despite treatment I think is best we think about getting you in with neurology for further evaluation.  Let us plan on meeting back here in about 2 weeks so I can see your progress and catch up on what your optometrist has said.    IF you received an x-ray today, you will receive an invoice from [facility]. Please contact [facility] at [phone] with questions or concerns regarding your invoice.   IF you received labwork today, you will receive an invoice from [facility]. Please contact [facility] at [phone] with questions or concerns regarding your invoice.   Our billing staff will not be able to assist you with questions regarding bills from these companies.  You will be contacted with the lab results as soon as they are available. The fastest way to get your results is to activate your My Chart account. Instructions are located on the last page of this paperwork. If you have not heard from us regarding the results in 2 weeks, please contact this office.

## 2017-11-17 NOTE — Addendum Note (Signed)
Addended by: Baldwin CrownJOHNSON, Drisana Schweickert D on: 11/17/2017 03:13 PM   Modules accepted: Orders

## 2017-11-17 NOTE — Addendum Note (Signed)
Addended by: Baldwin CrownJOHNSON, SHAQUETTA D on: 11/17/2017 03:25 PM   Modules accepted: Orders

## 2017-11-17 NOTE — Progress Notes (Signed)
11/17/2017 12:38 PM   DOB: 08-03-1961 / MRN: [id number]  SUBJECTIVE:  Ruben Snyder is a 57 y.o. male presenting for worsening left eye symptoms.  This is been ongoing and he has been evaluated by Dr. Dione Booze and diagnosed with a retinal tendinopathy.  Per Dr. Dione Booze, 80% of patients tend to go in remission with this specific condition in about 6 months.    Patient relates a history that is concerning for STI.  States that this problem started about 5 days after an unprotected oral and unprotected vaginal encounter with a male in [place].  States that his vision will be better or worse on some days but continues to have left eye irritation.  Has now began having to chalazions in the left upper eyelid as well.  Has been using warm compresses as instructed, and has also gone through a regimen of topical erythromycin.  He has become very concerned about the possibility of STI and would like to be screened for that today as well as treated.  He denies any dysuria urgency frequency hematuria.  He would like to have HIV and syphilis screen as well.  He has an ophthalmologist appointment tomorrow and hopes he will learn more about his vision difficulty, as this optometrist has a previous picture of his retina.  He has no history of QT prolongation nor any antibiotic allergies  He is allergic to reglan [metoclopramide] and nsaids.   He  has a past medical history of Anxiety, Asthma, and ED (erectile dysfunction).    He  reports that he has quit smoking. He has quit using smokeless tobacco. He reports that he drinks alcohol. He reports that he does not use drugs. He  reports that he currently engages in sexual activity. He reports using the following method of birth control/protection: Condom. The patient  has a past surgical history that includes Knee surgery (Left).  His family history is not on file.  Review of Systems  Eyes: Positive for blurred vision, discharge and redness. Negative for double  vision, photophobia and pain.  Skin: Positive for itching. Negative for rash.  Neurological: Negative for dizziness.    The problem list and medications were reviewed and updated by myself where necessary and exist elsewhere in the encounter.   OBJECTIVE:  Pulse 69   Resp 16   Ht 6\' 3"  (1.905 m)   Wt 219 lb 3.2 oz (99.4 kg)   SpO2 98%   BMI 27.40 kg/m   Physical Exam  Constitutional: He appears well-developed. He is active and cooperative.  Non-toxic appearance.  Eyes:    Cardiovascular: Normal rate.  Pulmonary/Chest: Effort normal. No tachypnea.  Neurological: He is alert. No cranial nerve deficit. GCS eye subscore is 4. GCS verbal subscore is 5. GCS motor subscore is 6.  Skin: Skin is warm and dry. He is not diaphoretic. No pallor.  Vitals reviewed.   No results found for this or any previous visit (from the past 72 hour(s)).  No results found.  ASSESSMENT AND PLAN:  Ashaz was seen today for eye issue.  Diagnoses and all orders for this visit:  Chronic conjunctivitis of left eye, unspecified chronic conjunctivitis type: Vision changes, however not consistent. Given the chronicity of this patient's complaint and his level of concern for problem to I think it is best that we go ahead and cover for the potential of sexually transmitted disease.  If this problem does not remit,  we will consider sending him to neurology for  further evaluation. -     GC/Chlamydia Probe Amp -     GC/Chlamydia Probe Amp -     HIV antibody -     RPR  History of unprotected sex    The patient is advised to call or return to clinic if he does not see an improvement in symptoms, or to seek the care of the closest emergency department if he worsens with the above plan.   Deliah BostonMichael Clark, MHS, PA-C Primary Care at [facility] 11/17/2017 12:38 PM

## 2017-11-18 LAB — HIV ANTIBODY (ROUTINE TESTING W REFLEX): HIV Screen 4th Generation wRfx: NONREACTIVE

## 2017-11-18 LAB — RPR: RPR: NONREACTIVE

## 2017-11-19 LAB — GC/CHLAMYDIA PROBE AMP
Chlamydia trachomatis, NAA: NEGATIVE
NEISSERIA GONORRHOEAE BY PCR: NEGATIVE

## 2017-11-19 LAB — CHLAMYDIA, CONJUNCTIVA, NAA

## 2017-11-19 LAB — GONOCOCCUS DNA, PCR

## 2017-11-20 ENCOUNTER — Telehealth: Payer: Self-pay | Admitting: Physician Assistant

## 2017-11-20 NOTE — Telephone Encounter (Signed)
Copied from CRM [id number]. Topic: Quick Communication - See Telephone Encounter >> Nov 20, 2017 12:22 PM Cipriano BunkerLambe, Annette S wrote: CRM for notification. See Telephone encounter for:   Replying to message sent in my chart:  Doctor asked about symptoms pt is having and if getting better:    Patient could not see for 6 weeks out of his left eye and with the antibiotics he has seen a hugh improvement,  The very next day,15th  he could see out of his left eye.  The 16th it was not as good but could see (fuzzy) but today the 17th is good again.  Seems the zpack and doxycycline (VIBRAMYCIN) 100 MG capsule are working good as of now.   11/20/17.

## 2017-11-21 NOTE — Telephone Encounter (Signed)
Please review

## 2017-12-02 ENCOUNTER — Ambulatory Visit: Payer: BLUE CROSS/BLUE SHIELD | Admitting: Family Medicine

## 2017-12-03 DIAGNOSIS — H52223 Regular astigmatism, bilateral: Secondary | ICD-10-CM | POA: Diagnosis not present

## 2017-12-09 ENCOUNTER — Telehealth: Payer: Self-pay

## 2017-12-09 ENCOUNTER — Other Ambulatory Visit: Payer: Self-pay | Admitting: Family Medicine

## 2017-12-09 NOTE — Telephone Encounter (Signed)
Patient No Showed for Pre-Visit. A message was left for him to call and reschedule his PV before 5:00 Pm today. If we do not hear from him by 5:00 today a no show letter will be mailed and his colonoscopy that is scheduled on 12/23/17 with Dr. Rhea BeltonPyrtle will be cancelled per [facility] guidelines.   Janalee DaneNancy Else Habermann, LPN  ( PV )

## 2017-12-10 ENCOUNTER — Encounter: Payer: Self-pay | Admitting: Physician Assistant

## 2017-12-23 ENCOUNTER — Encounter: Payer: BLUE CROSS/BLUE SHIELD | Admitting: Internal Medicine

## 2018-01-07 DIAGNOSIS — J301 Allergic rhinitis due to pollen: Secondary | ICD-10-CM | POA: Diagnosis not present

## 2018-01-07 DIAGNOSIS — J3089 Other allergic rhinitis: Secondary | ICD-10-CM | POA: Diagnosis not present

## 2018-01-07 DIAGNOSIS — J4530 Mild persistent asthma, uncomplicated: Secondary | ICD-10-CM | POA: Diagnosis not present

## 2018-01-07 DIAGNOSIS — J3081 Allergic rhinitis due to animal (cat) (dog) hair and dander: Secondary | ICD-10-CM | POA: Diagnosis not present

## 2018-01-08 ENCOUNTER — Other Ambulatory Visit: Payer: Self-pay | Admitting: Family Medicine

## 2018-01-12 ENCOUNTER — Other Ambulatory Visit: Payer: Self-pay | Admitting: Family Medicine

## 2018-01-22 ENCOUNTER — Other Ambulatory Visit: Payer: Self-pay | Admitting: Family Medicine

## 2018-01-22 NOTE — Telephone Encounter (Signed)
Refill of Cialis  LOV 11/17/17  M. Clark  [facility] [address]  [address]

## 2018-01-24 NOTE — Telephone Encounter (Signed)
Called pt and advised him of the needed OV for the refill RX. He made an appt with Ruben Snyder for Monday at 1:40 pm

## 2018-01-26 ENCOUNTER — Other Ambulatory Visit: Payer: Self-pay

## 2018-01-26 ENCOUNTER — Encounter: Payer: Self-pay | Admitting: Physician Assistant

## 2018-01-26 ENCOUNTER — Ambulatory Visit (INDEPENDENT_AMBULATORY_CARE_PROVIDER_SITE_OTHER): Payer: BLUE CROSS/BLUE SHIELD

## 2018-01-26 ENCOUNTER — Ambulatory Visit: Payer: BLUE CROSS/BLUE SHIELD | Admitting: Physician Assistant

## 2018-01-26 VITALS — BP 123/79 | HR 68 | Temp 98.4°F | Resp 16 | Ht 73.0 in | Wt 221.0 lb

## 2018-01-26 DIAGNOSIS — M25641 Stiffness of right hand, not elsewhere classified: Secondary | ICD-10-CM

## 2018-01-26 DIAGNOSIS — M79644 Pain in right finger(s): Secondary | ICD-10-CM | POA: Diagnosis not present

## 2018-01-26 DIAGNOSIS — N529 Male erectile dysfunction, unspecified: Secondary | ICD-10-CM

## 2018-01-26 MED ORDER — TADALAFIL 20 MG PO TABS: 20.0000 mg | ORAL_TABLET | Freq: Every day | ORAL | 11 refills | Status: AC | PRN

## 2018-01-26 NOTE — Progress Notes (Signed)
01/26/2018 2:37 PM   DOB: July 03, 1961 / MRN: [id number]  SUBJECTIVE:  Ruben Snyder is a pleasant 57 y.o. male presenting for here for Cialis refills.  Has been taking this for an extended period of time and denies any presyncope or vision changes associated with taking the medication.  He did try sildenafil however this was not as effective for him.  Reports that 10 pills will last him roughly 1 month.  He complains of right index PIP pain that has been present now for about 3 weeks and he describes it as a stiffness, only in the morning.  States that hot water seems to make the pain better.  Denies a history of rheumatological disorder and denies a family history of same.  Denies any recent injury.  He is allergic to reglan [metoclopramide] and nsaids.   He  has a past medical history of Anxiety, Asthma, and ED (erectile dysfunction).    He  reports that he has quit smoking. He has quit using smokeless tobacco. He reports that he drinks alcohol. He reports that he does not use drugs. He  reports that he currently engages in sexual activity. He reports using the following method of birth control/protection: Condom. The patient  has a past surgical history that includes Knee surgery (Left).  His family history is not on file.  Review of Systems  Constitutional: Negative for chills, diaphoresis and fever.  Respiratory: Negative for cough, hemoptysis, sputum production, shortness of breath and wheezing.   Cardiovascular: Negative for chest pain, orthopnea and leg swelling.  Gastrointestinal: Negative for nausea.  Musculoskeletal: Positive for joint pain. Negative for myalgias.  Skin: Negative for rash.  Neurological: Negative for dizziness.    The problem list and medications were reviewed and updated by myself where necessary and exist elsewhere in the encounter.   OBJECTIVE:  BP 123/79   Pulse 68   Temp 98.4 F (36.9 C) (Oral)   Resp 16   Ht 6\' 1"  (1.854 m)   Wt 221 lb  (100.2 kg)   SpO2 100%   BMI 29.16 kg/m   Physical Exam  Constitutional: He appears well-developed. He is active and cooperative.  Non-toxic appearance.  Cardiovascular: Normal rate, regular rhythm, S1 normal, S2 normal, normal heart sounds, intact distal pulses and normal pulses. Exam reveals no gallop and no friction rub.  No murmur heard. Pulmonary/Chest: Effort normal. No stridor. No tachypnea. No respiratory distress. He has no wheezes. He has no rales.  Abdominal: He exhibits no distension.  Musculoskeletal: He exhibits no edema.  Neurological: He is alert.  Skin: Skin is warm and dry. He is not diaphoretic. No pallor.  Vitals reviewed.   No results found for this or any previous visit (from the past 72 hour(s)).  Dg Finger Index Right  Result Date: 01/26/2018 CLINICAL DATA:  57 y/o  M; morning stiffness about the PIP. EXAM: RIGHT INDEX FINGER 2+V COMPARISON:  None. FINDINGS: There is no evidence of fracture or dislocation. There is no evidence of arthropathy or other focal bone abnormality. Soft tissues are unremarkable. IMPRESSION: Negative. Electronically Signed   By: Mitzi HansenLance  Furusawa-Stratton M.D.   On: 01/26/2018 14:31    ASSESSMENT AND PLAN:  Ruben RuizJohn was seen today for medication refill and right index finger.  Diagnoses and all orders for this visit:  Finger stiffness, right: His finger appears normal to me.  Has normal function at this time.  X-ray is normal thus unlikely to be osteoarthritis.  I will screen  him for inflammatory arthritis with a sed rate and CRP.  These are negative I will hold any further testing.  If these are positive I will run a lupus panel along with a rheumatoid factor and anti-CCP. -     DG Finger Index Right; Future -     Sedimentation Rate -     C-reactive protein  Erectile dysfunction, unspecified erectile dysfunction type -     tadalafil (CIALIS) 20 MG tablet; Take 1 tablet (20 mg total) by mouth daily as needed for erectile  dysfunction.    The patient is advised to call or return to clinic if he does not see an improvement in symptoms, or to seek the care of the closest emergency department if he worsens with the above plan.   Deliah Boston, MHS, PA-C Primary Care at [facility] 01/26/2018 2:37 PM

## 2018-01-26 NOTE — Patient Instructions (Addendum)
Your x-ray is normal Ewen.  I am going to check a sed rate and CRP and if these are negative I think we can chalk this up to an unknown and not dangerous etiology.  If these do come back positive I will be sure to send out for some rheumatological labs.  I will keep in touch with you throughout the process.  I am glad you well today.    IF you received an x-ray today, you will receive an invoice from [facility]. Please contact [facility] at [phone] with questions or concerns regarding your invoice.   IF you received labwork today, you will receive an invoice from [facility]. Please contact [facility] at [phone] with questions or concerns regarding your invoice.   Our billing staff will not be able to assist you with questions regarding bills from these companies.  You will be contacted with the lab results as soon as they are available. The fastest way to get your results is to activate your My Chart account. Instructions are located on the last page of this paperwork. If you have not heard from us regarding the results in 2 weeks, please contact this office.

## 2018-01-27 LAB — SEDIMENTATION RATE: Sed Rate: 5 mm/hr (ref 0–30)

## 2018-01-27 LAB — C-REACTIVE PROTEIN: CRP: 0.3 mg/L (ref 0.0–4.9)

## 2018-02-08 ENCOUNTER — Other Ambulatory Visit: Payer: Self-pay | Admitting: Family Medicine

## 2018-02-09 NOTE — Telephone Encounter (Signed)
fluticasone refill Last OV: 02/04/17 Last Refill:01/08/18 Pharmacy:[facility] [address] PCP: Deliah BostonMichael Clark PA-C

## 2018-03-04 ENCOUNTER — Other Ambulatory Visit: Payer: Self-pay | Admitting: Physician Assistant

## 2018-03-09 ENCOUNTER — Other Ambulatory Visit: Payer: Self-pay | Admitting: Physician Assistant

## 2018-03-30 ENCOUNTER — Encounter: Payer: Self-pay | Admitting: Family Medicine

## 2018-04-03 ENCOUNTER — Encounter: Payer: Self-pay | Admitting: Neurology

## 2018-05-13 ENCOUNTER — Ambulatory Visit: Payer: BLUE CROSS/BLUE SHIELD | Admitting: Physician Assistant
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
## 2018-06-16 ENCOUNTER — Ambulatory Visit: Payer: BLUE CROSS/BLUE SHIELD | Admitting: Neurology

## 2018-06-16 ENCOUNTER — Encounter: Payer: Self-pay | Admitting: Neurology

## 2018-06-16 VITALS — BP 126/78 | HR 70 | Ht 75.0 in | Wt 222.0 lb

## 2018-06-16 DIAGNOSIS — G2581 Restless legs syndrome: Secondary | ICD-10-CM | POA: Diagnosis not present

## 2018-06-16 DIAGNOSIS — G250 Essential tremor: Secondary | ICD-10-CM | POA: Diagnosis not present

## 2018-06-16 DIAGNOSIS — S76312A Strain of muscle, fascia and tendon of the posterior muscle group at thigh level, left thigh, initial encounter: Secondary | ICD-10-CM | POA: Diagnosis not present

## 2018-06-16 MED ORDER — GABAPENTIN 100 MG PO CAPS: 100.0000 mg | ORAL_CAPSULE | Freq: Every day | ORAL | 3 refills | Status: DC

## 2018-06-16 NOTE — Patient Instructions (Signed)
1.  For restless leg, try gabapentin 100mg  at bedtime.  If ineffective, you may try up to 3 capsules at bedtime 2.  For hamstring pain, we will refer you to Dr. Antoine [facility]asZachary Smith at [facility] 3.  Monitor tremor 4.  Follow up in 4 months.

## 2018-06-16 NOTE — Progress Notes (Deleted)
Tawana ScaleZach Snyder D.O.  Sports Medicine [address] [place], [place] [address] Phone: [phone] Subjective:    I'm seeing this patient by the request  of:    CC:   BJY:NWGNFAOZHYHPI:Subjective  Ruben Snyder is a 57 y.o. male coming in with complaint of ***  Onset-  Location Duration-  Character- Aggravating factors- Reliving factors-  Therapies tried-  Severity-     Past Medical History:  Diagnosis Date  . Anxiety   . Asthma   . ED (erectile dysfunction)    Past Surgical History:  Procedure Laterality Date  . KNEE SURGERY Left    age 57   Social History   Socioeconomic History  . Marital status: Divorced    Spouse name: Not on file  . Number of children: 2  . Years of education: Not on file  . Highest education level: Professional school degree (e.g., MD, DDS, DVM, JD)  Occupational History  . Occupation: Programme researcher, broadcasting/film/videoAttorney    Employer: [employer]  Social Needs  . Financial resource strain: Not on file  . Food insecurity:    Worry: Not on file    Inability: Not on file  . Transportation needs:    Medical: Not on file    Non-medical: Not on file  Tobacco Use  . Smoking status: Former Games developermoker  . Smokeless tobacco: Former Engineer, waterUser  Substance and Sexual Activity  . Alcohol use: Yes    Alcohol/week: 4.0 standard drinks    Types: 4 Cans of beer per week    Comment: twice a month average  . Drug use: No  . Sexual activity: Yes    Birth control/protection: Condom  Lifestyle  . Physical activity:    Days per week: Not on file    Minutes per session: Not on file  . Stress: Not on file  Relationships  . Social connections:    Talks on phone: Not on file    Gets together: Not on file    Attends religious service: Not on file    Active member of club or organization: Not on file    Attends meetings of clubs or organizations: Not on file    Relationship status: Not on file  Other Topics Concern  . Not on file  Social History Narrative   Patient is right-handed.  He lives alone in a 2 story house. He drinks 1 cup of coffee a day. He walks his dog 1-2 miles a day. He [place] sailing [place]   Allergies  Allergen Reactions  . Reglan [Metoclopramide]   . Nsaids     hives   Family History  Problem Relation Age of Onset  . Parkinson's disease Mother   . Mitral valve prolapse Mother   . Dementia Mother      Past medical history, social, surgical and family history all reviewed in electronic medical record.  No pertanent information unless stated regarding to the chief complaint.   Review of Systems:Review of systems updated and as accurate as of 06/16/18  No headache, visual changes, nausea, vomiting, diarrhea, constipation, dizziness, abdominal pain, skin rash, fevers, chills, night sweats, weight loss, swollen lymph nodes, body aches, joint swelling, muscle aches, chest pain, shortness of breath, mood changes.   Objective  There were no vitals taken for this visit. Systems examined below as of 06/16/18   General: No apparent distress alert and oriented x3 mood and affect normal, dressed appropriately.  HEENT: Pupils equal, extraocular movements intact  Respiratory: Patient's speak in full sentences and does not appear short of breath  Cardiovascular: No lower extremity edema, non tender, no erythema  Skin: Warm dry intact with no signs of infection or rash on extremities or on axial skeleton.  Abdomen: Soft nontender  Neuro: Cranial nerves II through XII are intact, neurovascularly intact in all extremities with 2+ DTRs and 2+ pulses.  Lymph: No lymphadenopathy of posterior or anterior cervical chain or axillae bilaterally.  Gait normal with good balance and coordination.  MSK:  Non tender with full range of motion and good stability and symmetric strength and tone of shoulders, elbows, wrist, hip, knee and ankles bilaterally.     Impression and Recommendations:     This case required medical  decision making of moderate complexity.      Note: This dictation was prepared with Dragon dictation along with smaller phrase technology. Any transcriptional errors that result from this process are unintentional.

## 2018-06-17 ENCOUNTER — Ambulatory Visit: Payer: BLUE CROSS/BLUE SHIELD | Admitting: Family Medicine

## 2018-06-17 DIAGNOSIS — Z0289 Encounter for other administrative examinations: Secondary | ICD-10-CM

## 2018-06-25 ENCOUNTER — Encounter: Payer: Self-pay | Admitting: General Practice

## 2018-07-20 ENCOUNTER — Ambulatory Visit: Payer: BLUE CROSS/BLUE SHIELD | Admitting: Family Medicine

## 2018-07-20 DIAGNOSIS — Z0289 Encounter for other administrative examinations: Secondary | ICD-10-CM

## 2018-07-20 NOTE — Progress Notes (Deleted)
Tawana ScaleZach Sid Greener D.O. [facility] [address] [place], [place] [address] Phone: [phone] Subjective:    I'm seeing this patient by the request  of:    CC:   BJY:NWGNFAOZHYHPI:Subjective  Ruben Snyder is a 57 y.o. male coming in with complaint of ***  Onset-  Location Duration-  Character- Aggravating factors- Reliving factors-  Therapies tried-  Severity-     Past Medical History:  Diagnosis Date  . Anxiety   . Asthma   . ED (erectile dysfunction)    Past Surgical History:  Procedure Laterality Date  . KNEE SURGERY Left    age 57   Social History   Socioeconomic History  . Marital status: Divorced    Spouse name: Not on file  . Number of children: 2  . Years of education: Not on file  . Highest education level: Professional school degree (e.g., MD, DDS, DVM, JD)  Occupational History  . Occupation: Programme researcher, broadcasting/film/videoAttorney    Employer: [employer]  Social Needs  . Financial resource strain: Not on file  . Food insecurity:    Worry: Not on file    Inability: Not on file  . Transportation needs:    Medical: Not on file    Non-medical: Not on file  Tobacco Use  . Smoking status: Former Games developermoker  . Smokeless tobacco: Former Engineer, waterUser  Substance and Sexual Activity  . Alcohol use: Yes    Alcohol/week: 4.0 standard drinks    Types: 4 Cans of beer per week    Comment: twice a month average  . Drug use: No  . Sexual activity: Yes    Birth control/protection: Condom  Lifestyle  . Physical activity:    Days per week: Not on file    Minutes per session: Not on file  . Stress: Not on file  Relationships  . Social connections:    Talks on phone: Not on file    Gets together: Not on file    Attends religious service: Not on file    Active member of club or organization: Not on file    Attends meetings of clubs or organizations: Not on file    Relationship status: Not on file  Other Topics Concern  . Not on file  Social History Narrative   Patient is right-handed.  He lives alone in a 2 story house. He drinks 1 cup of coffee a day. He walks his dog 1-2 miles a day. He [place] sailing his boat and [place]   Allergies  Allergen Reactions  . Reglan [Metoclopramide]   . Nsaids     hives   Family History  Problem Relation Age of Onset  . Parkinson's disease Mother   . Mitral valve prolapse Mother   . Dementia Mother      Current Outpatient Medications (Cardiovascular):  .  tadalafil (CIALIS) 20 MG tablet, Take 1 tablet (20 mg total) by mouth daily as needed for erectile dysfunction.  Current Outpatient Medications (Respiratory):  .  fluticasone (FLONASE) 50 MCG/ACT nasal spray, SPRAY 2 SPRAYS INTO EACH NOSTRIL EVERY DAY .  Fluticasone Furoate (ARNUITY ELLIPTA) 100 MCG/ACT AEPB, Inhale into the lungs. Marland Kitchen.  PROAIR HFA 108 (90 Base) MCG/ACT inhaler, INHALE 2 PUFFS INTO THE LUNGS EVERY 6 (SIX) HOURS AS NEEDED.    Current Outpatient Medications (Other):  Marland Kitchen.  ALPRAZolam (XANAX) 1 MG tablet, Take 1 tablet (1 mg total) by mouth 2 (two) times daily as needed for anxiety. .  gabapentin (  NEURONTIN) 100 MG capsule, Take 1 capsule (100 mg total) by mouth at bedtime. Marland Kitchen  LORazepam (ATIVAN) 0.5 MG tablet, Take 0.5 mg by mouth 2 (two) times daily.    Past medical history, social, surgical and family history all reviewed in electronic medical record.  No pertanent information unless stated regarding to the chief complaint.   Review of Systems:  No headache, visual changes, nausea, vomiting, diarrhea, constipation, dizziness, abdominal pain, skin rash, fevers, chills, night sweats, weight loss, swollen lymph nodes, body aches, joint swelling, muscle aches, chest pain, shortness of breath, mood changes.   Objective  There were no vitals taken for this visit. Systems examined below as of    General: No apparent distress alert and oriented x3 mood and affect normal, dressed appropriately.  HEENT: Pupils equal, extraocular movements  intact  Respiratory: Patient's speak in full sentences and does not appear short of breath  Cardiovascular: No lower extremity edema, non tender, no erythema  Skin: Warm dry intact with no signs of infection or rash on extremities or on axial skeleton.  Abdomen: Soft nontender  Neuro: Cranial nerves II through XII are intact, neurovascularly intact in all extremities with 2+ DTRs and 2+ pulses.  Lymph: No lymphadenopathy of posterior or anterior cervical chain or axillae bilaterally.  Gait normal with good balance and coordination.  MSK:  Non tender with full range of motion and good stability and symmetric strength and tone of shoulders, elbows, wrist, hip, knee and ankles bilaterally.     Impression and Recommendations:     This case required medical decision making of moderate complexity. The above documentation has been reviewed and is accurate and complete Judi Saa, DO       Note: This dictation was prepared with Dragon dictation along with smaller phrase technology. Any transcriptional errors that result from this process are unintentional.

## 2018-07-27 DIAGNOSIS — J3081 Allergic rhinitis due to animal (cat) (dog) hair and dander: Secondary | ICD-10-CM | POA: Diagnosis not present

## 2018-07-27 DIAGNOSIS — J4531 Mild persistent asthma with (acute) exacerbation: Secondary | ICD-10-CM | POA: Diagnosis not present

## 2018-07-27 DIAGNOSIS — J301 Allergic rhinitis due to pollen: Secondary | ICD-10-CM | POA: Diagnosis not present

## 2018-07-27 DIAGNOSIS — J4530 Mild persistent asthma, uncomplicated: Secondary | ICD-10-CM | POA: Diagnosis not present

## 2018-09-22 DIAGNOSIS — J3089 Other allergic rhinitis: Secondary | ICD-10-CM | POA: Diagnosis not present

## 2018-09-22 DIAGNOSIS — J301 Allergic rhinitis due to pollen: Secondary | ICD-10-CM | POA: Diagnosis not present

## 2018-09-22 DIAGNOSIS — J4530 Mild persistent asthma, uncomplicated: Secondary | ICD-10-CM | POA: Diagnosis not present

## 2018-09-22 DIAGNOSIS — J3081 Allergic rhinitis due to animal (cat) (dog) hair and dander: Secondary | ICD-10-CM | POA: Diagnosis not present

## 2018-10-09 ENCOUNTER — Other Ambulatory Visit: Payer: Self-pay | Admitting: Neurology

## 2018-10-22 ENCOUNTER — Ambulatory Visit: Payer: BLUE CROSS/BLUE SHIELD | Admitting: Family Medicine

## 2018-10-22 DIAGNOSIS — M549 Dorsalgia, unspecified: Secondary | ICD-10-CM | POA: Diagnosis not present

## 2018-10-22 DIAGNOSIS — R252 Cramp and spasm: Secondary | ICD-10-CM | POA: Diagnosis not present

## 2018-11-10 ENCOUNTER — Ambulatory Visit: Payer: BLUE CROSS/BLUE SHIELD | Admitting: Neurology

## 2018-11-11 ENCOUNTER — Ambulatory Visit: Payer: BLUE CROSS/BLUE SHIELD | Admitting: Neurology

## 2018-12-10 DIAGNOSIS — F419 Anxiety disorder, unspecified: Secondary | ICD-10-CM | POA: Diagnosis not present

## 2018-12-10 DIAGNOSIS — H9313 Tinnitus, bilateral: Secondary | ICD-10-CM | POA: Diagnosis not present

## 2018-12-10 DIAGNOSIS — J301 Allergic rhinitis due to pollen: Secondary | ICD-10-CM | POA: Diagnosis not present

## 2018-12-10 DIAGNOSIS — J4530 Mild persistent asthma, uncomplicated: Secondary | ICD-10-CM | POA: Diagnosis not present

## 2019-07-13 DIAGNOSIS — J4530 Mild persistent asthma, uncomplicated: Secondary | ICD-10-CM | POA: Diagnosis not present

## 2019-07-13 DIAGNOSIS — J301 Allergic rhinitis due to pollen: Secondary | ICD-10-CM | POA: Diagnosis not present

## 2019-07-13 DIAGNOSIS — J3089 Other allergic rhinitis: Secondary | ICD-10-CM | POA: Diagnosis not present

## 2019-07-13 DIAGNOSIS — J3081 Allergic rhinitis due to animal (cat) (dog) hair and dander: Secondary | ICD-10-CM | POA: Diagnosis not present

## 2019-08-03 DIAGNOSIS — H9313 Tinnitus, bilateral: Secondary | ICD-10-CM | POA: Diagnosis not present

## 2019-08-03 DIAGNOSIS — H903 Sensorineural hearing loss, bilateral: Secondary | ICD-10-CM | POA: Diagnosis not present

## 2019-08-04 DIAGNOSIS — Z0000 Encounter for general adult medical examination without abnormal findings: Secondary | ICD-10-CM | POA: Diagnosis not present

## 2019-08-04 DIAGNOSIS — Z1159 Encounter for screening for other viral diseases: Secondary | ICD-10-CM | POA: Diagnosis not present

## 2019-08-04 DIAGNOSIS — Z13220 Encounter for screening for lipoid disorders: Secondary | ICD-10-CM | POA: Diagnosis not present

## 2019-08-04 DIAGNOSIS — Z125 Encounter for screening for malignant neoplasm of prostate: Secondary | ICD-10-CM | POA: Diagnosis not present

## 2019-08-04 DIAGNOSIS — F419 Anxiety disorder, unspecified: Secondary | ICD-10-CM | POA: Diagnosis not present

## 2019-08-04 DIAGNOSIS — Z23 Encounter for immunization: Secondary | ICD-10-CM | POA: Diagnosis not present

## 2019-08-04 DIAGNOSIS — J301 Allergic rhinitis due to pollen: Secondary | ICD-10-CM | POA: Diagnosis not present

## 2019-08-04 DIAGNOSIS — J4530 Mild persistent asthma, uncomplicated: Secondary | ICD-10-CM | POA: Diagnosis not present

## 2020-04-27 DIAGNOSIS — H52223 Regular astigmatism, bilateral: Secondary | ICD-10-CM | POA: Diagnosis not present

## 2020-05-11 DIAGNOSIS — D1722 Benign lipomatous neoplasm of skin and subcutaneous tissue of left arm: Secondary | ICD-10-CM | POA: Diagnosis not present

## 2020-07-12 DIAGNOSIS — J3089 Other allergic rhinitis: Secondary | ICD-10-CM | POA: Diagnosis not present

## 2020-07-12 DIAGNOSIS — J4530 Mild persistent asthma, uncomplicated: Secondary | ICD-10-CM | POA: Diagnosis not present

## 2020-07-12 DIAGNOSIS — J3081 Allergic rhinitis due to animal (cat) (dog) hair and dander: Secondary | ICD-10-CM | POA: Diagnosis not present

## 2020-07-12 DIAGNOSIS — J301 Allergic rhinitis due to pollen: Secondary | ICD-10-CM | POA: Diagnosis not present

## 2020-08-08 DIAGNOSIS — J4530 Mild persistent asthma, uncomplicated: Secondary | ICD-10-CM | POA: Diagnosis not present

## 2020-08-08 DIAGNOSIS — G2581 Restless legs syndrome: Secondary | ICD-10-CM | POA: Diagnosis not present

## 2020-08-08 DIAGNOSIS — F419 Anxiety disorder, unspecified: Secondary | ICD-10-CM | POA: Diagnosis not present

## 2020-08-08 DIAGNOSIS — J301 Allergic rhinitis due to pollen: Secondary | ICD-10-CM | POA: Diagnosis not present

## 2020-08-08 DIAGNOSIS — Z23 Encounter for immunization: Secondary | ICD-10-CM | POA: Diagnosis not present

## 2020-08-08 DIAGNOSIS — Z0000 Encounter for general adult medical examination without abnormal findings: Secondary | ICD-10-CM | POA: Diagnosis not present

## 2020-11-28 DIAGNOSIS — Z0000 Encounter for general adult medical examination without abnormal findings: Secondary | ICD-10-CM | POA: Diagnosis not present

## 2021-07-04 DIAGNOSIS — M7918 Myalgia, other site: Secondary | ICD-10-CM | POA: Diagnosis not present

## 2021-07-10 DIAGNOSIS — H52223 Regular astigmatism, bilateral: Secondary | ICD-10-CM | POA: Diagnosis not present

## 2021-07-24 DIAGNOSIS — J3089 Other allergic rhinitis: Secondary | ICD-10-CM | POA: Diagnosis not present

## 2021-07-24 DIAGNOSIS — J3081 Allergic rhinitis due to animal (cat) (dog) hair and dander: Secondary | ICD-10-CM | POA: Diagnosis not present

## 2021-07-24 DIAGNOSIS — J4530 Mild persistent asthma, uncomplicated: Secondary | ICD-10-CM | POA: Diagnosis not present

## 2021-07-24 DIAGNOSIS — J301 Allergic rhinitis due to pollen: Secondary | ICD-10-CM | POA: Diagnosis not present

## 2021-07-25 DIAGNOSIS — M7918 Myalgia, other site: Secondary | ICD-10-CM | POA: Diagnosis not present

## 2021-07-26 ENCOUNTER — Other Ambulatory Visit: Payer: Self-pay | Admitting: Sports Medicine

## 2021-07-26 ENCOUNTER — Ambulatory Visit
Admission: RE | Admit: 2021-07-26 | Discharge: 2021-07-26 | Disposition: A | Discharge status: Home / Self Care | Payer: BC Managed Care – PPO | Source: Ambulatory Visit | Attending: Sports Medicine | Admitting: Sports Medicine

## 2021-07-26 DIAGNOSIS — M25552 Pain in left hip: Secondary | ICD-10-CM

## 2021-09-04 DIAGNOSIS — J4530 Mild persistent asthma, uncomplicated: Secondary | ICD-10-CM | POA: Diagnosis not present

## 2021-09-04 DIAGNOSIS — Z125 Encounter for screening for malignant neoplasm of prostate: Secondary | ICD-10-CM | POA: Diagnosis not present

## 2021-09-04 DIAGNOSIS — Z0000 Encounter for general adult medical examination without abnormal findings: Secondary | ICD-10-CM | POA: Diagnosis not present

## 2021-09-04 DIAGNOSIS — J301 Allergic rhinitis due to pollen: Secondary | ICD-10-CM | POA: Diagnosis not present

## 2021-09-04 DIAGNOSIS — Z23 Encounter for immunization: Secondary | ICD-10-CM | POA: Diagnosis not present

## 2021-09-04 DIAGNOSIS — R030 Elevated blood-pressure reading, without diagnosis of hypertension: Secondary | ICD-10-CM | POA: Diagnosis not present

## 2021-09-13 DIAGNOSIS — R002 Palpitations: Secondary | ICD-10-CM | POA: Diagnosis not present

## 2021-09-19 DIAGNOSIS — R002 Palpitations: Secondary | ICD-10-CM | POA: Diagnosis not present

## 2021-09-19 DIAGNOSIS — I471 Supraventricular tachycardia: Secondary | ICD-10-CM | POA: Diagnosis not present

## 2021-10-10 DIAGNOSIS — R002 Palpitations: Secondary | ICD-10-CM | POA: Diagnosis not present

## 2021-10-18 DIAGNOSIS — I471 Supraventricular tachycardia: Secondary | ICD-10-CM | POA: Diagnosis not present

## 2021-10-18 DIAGNOSIS — F419 Anxiety disorder, unspecified: Secondary | ICD-10-CM | POA: Diagnosis not present

## 2021-10-18 DIAGNOSIS — R002 Palpitations: Secondary | ICD-10-CM | POA: Diagnosis not present

## 2021-10-29 ENCOUNTER — Other Ambulatory Visit: Payer: Self-pay

## 2021-10-29 ENCOUNTER — Emergency Department (HOSPITAL_COMMUNITY): Payer: BC Managed Care – PPO

## 2021-10-29 ENCOUNTER — Emergency Department (HOSPITAL_COMMUNITY)
Admission: EM | Admit: 2021-10-29 | Discharge: 2021-10-29 | Disposition: A | Discharge status: Home / Self Care | Payer: BC Managed Care – PPO | Attending: Emergency Medicine | Admitting: Emergency Medicine

## 2021-10-29 ENCOUNTER — Encounter (HOSPITAL_COMMUNITY): Payer: Self-pay

## 2021-10-29 DIAGNOSIS — S060X9A Concussion with loss of consciousness of unspecified duration, initial encounter: Secondary | ICD-10-CM | POA: Insufficient documentation

## 2021-10-29 DIAGNOSIS — J341 Cyst and mucocele of nose and nasal sinus: Secondary | ICD-10-CM | POA: Diagnosis not present

## 2021-10-29 DIAGNOSIS — W19XXXA Unspecified fall, initial encounter: Secondary | ICD-10-CM

## 2021-10-29 DIAGNOSIS — W01198A Fall on same level from slipping, tripping and stumbling with subsequent striking against other object, initial encounter: Secondary | ICD-10-CM | POA: Insufficient documentation

## 2021-10-29 DIAGNOSIS — Y9271 Barn as the place of occurrence of the external cause: Secondary | ICD-10-CM | POA: Diagnosis not present

## 2021-10-29 DIAGNOSIS — S0990XA Unspecified injury of head, initial encounter: Secondary | ICD-10-CM | POA: Diagnosis not present

## 2021-10-29 DIAGNOSIS — Z87891 Personal history of nicotine dependence: Secondary | ICD-10-CM | POA: Insufficient documentation

## 2021-10-29 DIAGNOSIS — S060X0A Concussion without loss of consciousness, initial encounter: Secondary | ICD-10-CM | POA: Diagnosis not present

## 2021-10-29 DIAGNOSIS — Z7951 Long term (current) use of inhaled steroids: Secondary | ICD-10-CM | POA: Insufficient documentation

## 2021-10-29 DIAGNOSIS — J329 Chronic sinusitis, unspecified: Secondary | ICD-10-CM | POA: Diagnosis not present

## 2021-10-29 DIAGNOSIS — R1110 Vomiting, unspecified: Secondary | ICD-10-CM | POA: Insufficient documentation

## 2021-10-29 DIAGNOSIS — S060XAA Concussion with loss of consciousness status unknown, initial encounter: Secondary | ICD-10-CM

## 2021-10-29 DIAGNOSIS — H532 Diplopia: Secondary | ICD-10-CM | POA: Diagnosis not present

## 2021-10-29 DIAGNOSIS — J45909 Unspecified asthma, uncomplicated: Secondary | ICD-10-CM | POA: Insufficient documentation

## 2021-10-29 DIAGNOSIS — S0101XA Laceration without foreign body of scalp, initial encounter: Secondary | ICD-10-CM | POA: Diagnosis not present

## 2021-10-29 NOTE — ED Notes (Signed)
Pt A&Ox4, walking with a steady gait unassisted.
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
## 2021-10-29 NOTE — ED Provider Notes (Signed)
Emergency Medicine Provider Triage Evaluation Note  Ruben Snyder , a 60 y.o. male  was evaluated in triage.  Pt complains of diplopia and vomiting after head injury.  She reports that last night approximately 10 PM he fell and hit his head.  Unsure of any loss of consciousness.  States that he had multiple episodes of vomiting after his fall.  Patient also complains of diplopia after his fall.  He is not on any blood thinners  Review of Systems  Positive: Diplopia, nausea, vomiting, head injury Negative: Neck pain, back pain, headache, numbness, weakness, saddle anesthesia, bowel/bladder dysfunction  Physical Exam  BP (!) 129/91 (BP Location: Left Arm)    Pulse 91    Temp 97.9 F (36.6 C) (Oral)    Resp 16    SpO2 96%  Gen:   Awake, no distress   Resp:  Normal effort  MSK:   Moves extremities without difficulty  Other:  No midline tenderness to cervical, thoracic, lumbar spine.  Contusion to occipital region of patient's head.  CN II through XII intact.  Patient moves all limbs equally without difficulty.  Patient able to stand and ambulate without difficulty.  Medical Decision Making  Medically screening exam initiated at 12:15 PM.  Appropriate orders placed.  Ruben Snyder was informed that the remainder of the evaluation will be completed by another provider, this initial triage assessment does not replace that evaluation, and the importance of remaining in the ED until their evaluation is complete.  Did patient's head injury with nausea/vomiting afterwards will obtain noncontrast head CT.   Haskel Schroeder, PA-C 10/29/21 1217    Rozelle Logan, DO 10/30/21 1616

## 2021-10-29 NOTE — ED Provider Notes (Signed)
Ruben Snyder DEPT Provider Note   CSN: [id number] Arrival date & time: 10/29/21  1054     History Chief Complaint  Patient presents with   Fall   Diplopia    Ruben Snyder is a 60 y.o. male presents to the emergency department with a chief complaint of diplopia and vomiting after head injury.  Reports that lasted approximately 2200 he suffered a fall.  Patient does not have recollection of events of fall or if there is any loss of consciousness involved.  Patient does remember multiple episodes of vomiting after this fall.  Patient states that he woke this morning with diplopia.  Diplopia has gradually improved since then.  Patient denies any blood thinner use.  Patient endorses EtOH and marijuana use yesterday evening.  Patient denies any neck pain, back pain, numbness, weakness, saddle anesthesia, bowel/bladder dysfunction.     Fall Pertinent negatives include no chest pain, no abdominal pain, no headaches and no shortness of breath.      Past Medical History:  Diagnosis Date   Anxiety    Asthma    ED (erectile dysfunction)     Patient Active Problem List   Diagnosis Date Noted   Bronchospasm with bronchitis, acute 02/04/2017   Anxiety 05/26/2013   Impotence 05/26/2013    Past Surgical History:  Procedure Laterality Date   KNEE SURGERY Left    age 7       Family History  Problem Relation Age of Onset   Parkinson's disease Mother    Mitral valve prolapse Mother    Dementia Mother     Social History   Tobacco Use   Smoking status: Former   Smokeless tobacco: Former  Scientific laboratory technician Use: Never used  Substance Use Topics   Alcohol use: Yes    Alcohol/week: 4.0 standard drinks    Types: 4 Cans of beer per week    Comment: twice a month average   Drug use: No    Home Medications Prior to Admission medications   Medication Sig Start Date End Date Taking? Authorizing Provider  ALPRAZolam Duanne Moron) 1 MG tablet  Take 1 tablet (1 mg total) by mouth 2 (two) times daily as needed for anxiety. 10/07/16   Forrest Moron, MD  fluticasone (FLONASE) 50 MCG/ACT nasal spray SPRAY 2 SPRAYS INTO EACH NOSTRIL EVERY DAY 03/09/18   Tereasa Coop, PA-C  Fluticasone Furoate (ARNUITY ELLIPTA) 100 MCG/ACT AEPB Inhale into the lungs.    [provider]  gabapentin (NEURONTIN) 100 MG capsule TAKE 1 CAPSULE (100 MG TOTAL) BY MOUTH AT BEDTIME. 10/09/18   Jaffe, Adam R, DO  LORazepam (ATIVAN) 0.5 MG tablet Take 0.5 mg by mouth 2 (two) times daily. 03/09/18   [provider]  PROAIR HFA 108 (90 Base) MCG/ACT inhaler INHALE 2 PUFFS INTO THE LUNGS EVERY 6 (SIX) HOURS AS NEEDED. 04/17/17   Wardell Honour, MD  tadalafil (CIALIS) 20 MG tablet Take 1 tablet (20 mg total) by mouth daily as needed for erectile dysfunction. 01/26/18   Tereasa Coop, PA-C    Allergies    Reglan [metoclopramide] and Nsaids  Review of Systems   Review of Systems  Constitutional:  Negative for chills and fever.  HENT:  Negative for facial swelling.   Eyes:  Positive for visual disturbance.  Respiratory:  Negative for shortness of breath.   Cardiovascular:  Negative for chest pain.  Gastrointestinal:  Negative for abdominal pain, nausea and vomiting.  Genitourinary:  Negative for difficulty urinating, dysuria and enuresis.  Musculoskeletal:  Negative for back pain, neck pain and neck stiffness.  Skin:  Positive for wound. Negative for color change and rash.  Neurological:  Negative for dizziness, tremors, seizures, syncope, facial asymmetry, speech difficulty, weakness, light-headedness, numbness and headaches.  Psychiatric/Behavioral:  Negative for confusion.    Physical Exam Updated Vital Signs BP (!) 129/91 (BP Location: Left Arm)    Pulse 91    Temp 97.9 F (36.6 C) (Oral)    Resp 16    SpO2 96%   Physical Exam Vitals and nursing note reviewed.  Constitutional:      General: He is not in acute distress.    Appearance: He  is not ill-appearing, toxic-appearing or diaphoretic.  HENT:     Head: Normocephalic. Contusion present. No raccoon eyes, Battle's sign, right periorbital erythema or left periorbital erythema.      Comments: Contusion with overlying 0.6 cm laceration.  All hemorrhage controlled, no foreign bodies noted.  No tenderness to facial bones    Mouth/Throat:     Pharynx: Oropharynx is clear. Uvula midline. No pharyngeal swelling, oropharyngeal exudate, posterior oropharyngeal erythema or uvula swelling.  Eyes:     General: No visual field deficit or scleral icterus.       Right eye: No discharge.        Left eye: No discharge.     Extraocular Movements: Extraocular movements intact.     Conjunctiva/sclera: Conjunctivae normal.     Pupils: Pupils are equal, round, and reactive to light.  Cardiovascular:     Rate and Rhythm: Normal rate.  Pulmonary:     Effort: Pulmonary effort is normal.  Abdominal:     Palpations: Abdomen is soft.     Tenderness: There is no abdominal tenderness.  Musculoskeletal:     Cervical back: Normal range of motion and neck supple. No swelling, edema, deformity, erythema, signs of trauma, lacerations, rigidity, spasms, torticollis, tenderness, bony tenderness or crepitus. No pain with movement. Normal range of motion.     Thoracic back: No swelling, edema, deformity, signs of trauma, lacerations, spasms, tenderness or bony tenderness. Normal range of motion.     Lumbar back: No swelling, edema, deformity, signs of trauma, lacerations, spasms, tenderness or bony tenderness. Normal range of motion.     Comments: No midline tenderness or bony to cervical, thoracic, lumbar spine.  No tenderness, bony tenderness, or deformity to bilateral upper or lower extremities.  Skin:    General: Skin is warm and dry.  Neurological:     General: No focal deficit present.     Mental Status: He is alert and oriented to person, place, and time.     GCS: GCS eye subscore is 4. GCS verbal  subscore is 5. GCS motor subscore is 6.     Cranial Nerves: No cranial nerve deficit, dysarthria or facial asymmetry.     Sensory: Sensation is intact.     Motor: No weakness, tremor, seizure activity or pronator drift.     Coordination: Romberg sign negative. Finger-Nose-Finger Test normal.     Gait: Gait is intact. Gait normal.     Comments: CN II-XII intact, equal grip strength, +5 strength to bilateral upper and lower extremities, sensation to light touch intact to bilateral upper and lower extremities  Psychiatric:        Behavior: Behavior is cooperative.      ED Results / Procedures / Treatments   Labs (all labs ordered are listed, but  only abnormal results are displayed) Labs Reviewed - No data to display  EKG None  Radiology CT HEAD WO CONTRAST ( )  Result Date: 10/29/2021 CLINICAL DATA:  Trauma, vomiting EXAM: CT HEAD WITHOUT CONTRAST TECHNIQUE: Contiguous axial images were obtained from the base of the skull through the vertex without intravenous contrast. COMPARISON:  None. FINDINGS: Brain: No acute intracranial findings are seen. There are no signs of bleeding. Ventricles are not dilated. There is no focal edema or mass effect. Vascular: Unremarkable. Skull: Unremarkable. Sinuses/Orbits: Mucosal thickening is seen in the frontal, ethmoid and sphenoid sinuses. Mucous retention cyst is seen in the visualized upper portion of left maxillary sinus. Other: None. IMPRESSION: No acute intracranial findings are seen in noncontrast CT brain. Chronic sinusitis. Electronically Signed   By: Ernie Avena M.D.   On: 10/29/2021 13:43    Procedures .Marland KitchenLaceration Repair  Date/Time: 10/29/2021 3:02 PM Performed by: Haskel Schroeder, PA-C Authorized by: Haskel Schroeder, PA-C   Consent:    Consent obtained:  Verbal   Consent given by:  Patient   Risks discussed:  Infection, need for additional repair, pain, poor cosmetic result and poor wound healing   Alternatives  discussed:  No treatment and delayed treatment Universal protocol:    Procedure explained and questions answered to patient or proxy's satisfaction: yes     Immediately prior to procedure, a time out was called: yes     Patient identity confirmed:  Verbally with patient Anesthesia:    Anesthesia method:  None Laceration details:    Location:  Scalp   Scalp location:  Occipital   Length (cm):  6   Depth (mm):  1 Pre-procedure details:    Preparation:  Patient was prepped and draped in usual sterile fashion Exploration:    Wound exploration: entire depth of wound visualized     Wound extent: no foreign bodies/material noted   Treatment:    Area cleansed with:  Povidone-iodine Skin repair:    Repair method:  Staples   Number of staples:  2 Approximation:    Approximation:  Close Post-procedure details:    Dressing:  Open (no dressing)   Procedure completion:  Tolerated well, no immediate complications     Medications Ordered in ED Medications - No data to display  ED Course  I have reviewed the triage vital signs and the nursing notes.  Pertinent labs & imaging results that were available during my care of the patient were reviewed by me and considered in my medical decision making (see chart for details).    MDM Rules/Calculators/A&P                          Alert 60 year old male no acute distress, nontoxic-appearing.  Presents emergency department with complaint of diplopia and vomiting after head injury.  Patient denies any numbness, weakness, saddle anesthesia, bowel or bladder dysfunction.  No midline tenderness or deformity to cervical, thoracic, lumbar spine.  Neuro exam is reassuring.  Low suspicion for spinal injury at this time.  No neck pain or headache.  Low suspicion for vascular injury at this time.  Noncontrast head CT shows no acute intracranial abnormality.  Will havepatient follow-up with his ophthalmologist due to his diplopia.  If symptoms do not  improve in 2 days we will have patient follow-up with neurology.    Laceration to patient's scalp as noted above.  Patient had laceration repair via staples.  Will have staples removed in 7 days.  Patient was offered lidocaine injection for his staple procedure however declined.  Patient care was discussed with attending physician Dr. Dina Rich  Discussed results, findings, treatment and follow up. Patient advised of return precautions. Patient verbalized understanding and agreed with plan.      Final Clinical Impression(s) / ED Diagnoses Final diagnoses:  Fall, initial encounter  Concussion with unknown loss of consciousness status, initial encounter  Laceration of scalp, initial encounter    Rx / DC Orders ED Discharge Orders     None        Loni Beckwith, PA-C 10/29/21 2326    Lorelle Gibbs, DO 10/30/21 1649
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
## 2021-11-07 DIAGNOSIS — S0101XD Laceration without foreign body of scalp, subsequent encounter: Secondary | ICD-10-CM | POA: Diagnosis not present

## 2021-11-07 DIAGNOSIS — Z4802 Encounter for removal of sutures: Secondary | ICD-10-CM | POA: Diagnosis not present

## 2021-11-07 DIAGNOSIS — S060XAD Concussion with loss of consciousness status unknown, subsequent encounter: Secondary | ICD-10-CM | POA: Diagnosis not present

## 2022-07-16 DIAGNOSIS — F419 Anxiety disorder, unspecified: Secondary | ICD-10-CM | POA: Diagnosis not present

## 2022-07-24 DIAGNOSIS — J4530 Mild persistent asthma, uncomplicated: Secondary | ICD-10-CM | POA: Diagnosis not present

## 2022-07-24 DIAGNOSIS — J3089 Other allergic rhinitis: Secondary | ICD-10-CM | POA: Diagnosis not present

## 2022-07-24 DIAGNOSIS — J301 Allergic rhinitis due to pollen: Secondary | ICD-10-CM | POA: Diagnosis not present

## 2022-07-24 DIAGNOSIS — J3081 Allergic rhinitis due to animal (cat) (dog) hair and dander: Secondary | ICD-10-CM | POA: Diagnosis not present

## 2022-08-06 DIAGNOSIS — H52223 Regular astigmatism, bilateral: Secondary | ICD-10-CM | POA: Diagnosis not present

## 2022-09-18 DIAGNOSIS — Z23 Encounter for immunization: Secondary | ICD-10-CM | POA: Diagnosis not present

## 2022-09-18 DIAGNOSIS — Z0000 Encounter for general adult medical examination without abnormal findings: Secondary | ICD-10-CM | POA: Diagnosis not present

## 2022-09-18 DIAGNOSIS — G2581 Restless legs syndrome: Secondary | ICD-10-CM | POA: Diagnosis not present

## 2022-09-18 DIAGNOSIS — Z125 Encounter for screening for malignant neoplasm of prostate: Secondary | ICD-10-CM | POA: Diagnosis not present

## 2022-09-18 DIAGNOSIS — E7800 Pure hypercholesterolemia, unspecified: Secondary | ICD-10-CM | POA: Diagnosis not present

## 2022-09-20 DIAGNOSIS — E7800 Pure hypercholesterolemia, unspecified: Secondary | ICD-10-CM | POA: Diagnosis not present

## 2022-11-16 IMAGING — CT CT HEAD W/O CM
3 series · 16 of 47 positions shown, 19 images · non-contrast
Comparison: None.

CLINICAL DATA: Trauma, vomiting

EXAM:
CT HEAD WITHOUT CONTRAST
TECHNIQUE: Contiguous axial images were obtained from the base of the skull
through the vertex without intravenous contrast.

[Series 2: head wo · axial · 0.46mm/px · z∈[+1343,+1478]mm · 10 of 33 slices shown, 13 images]
[im 3/33  brain]
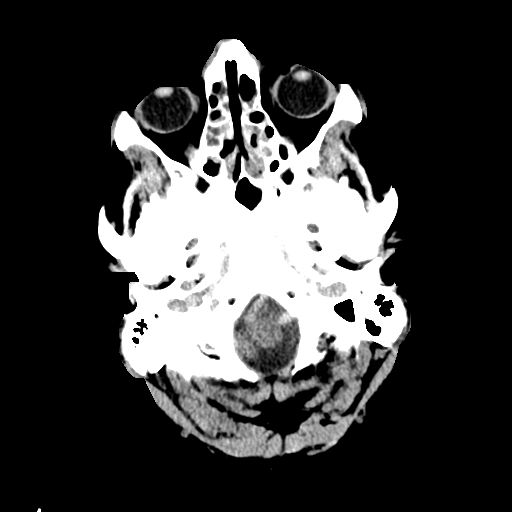
[im 3/33  bone]
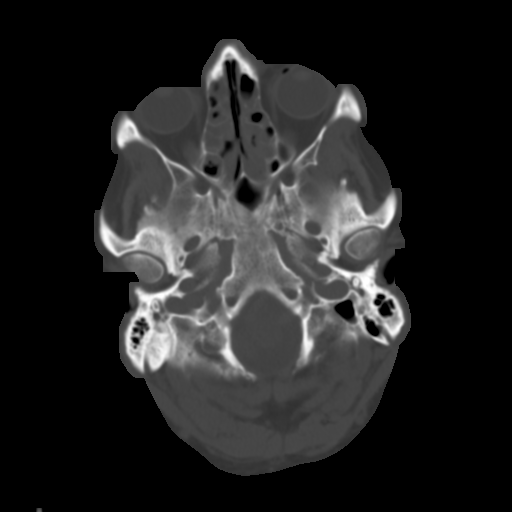
[im 6/33  brain]
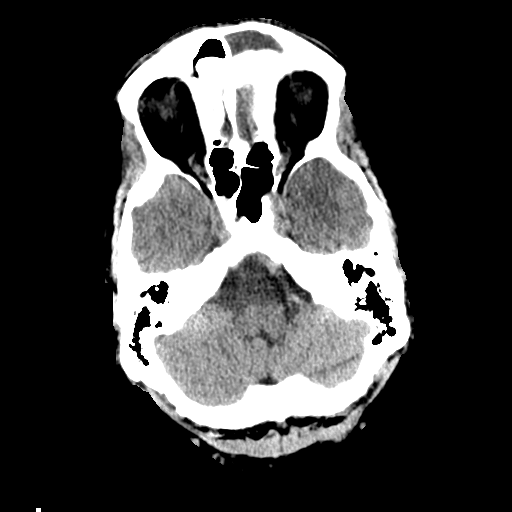
[im 9/33  brain]
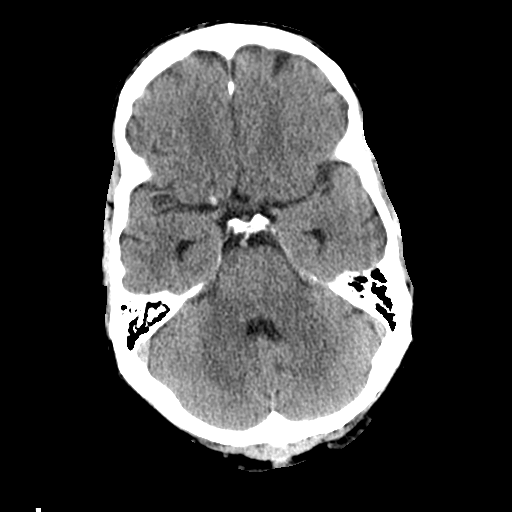
[im 12/33  brain]
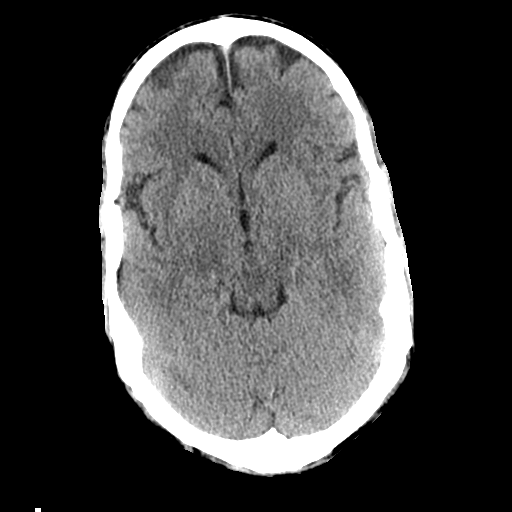
[im 15/33  brain]
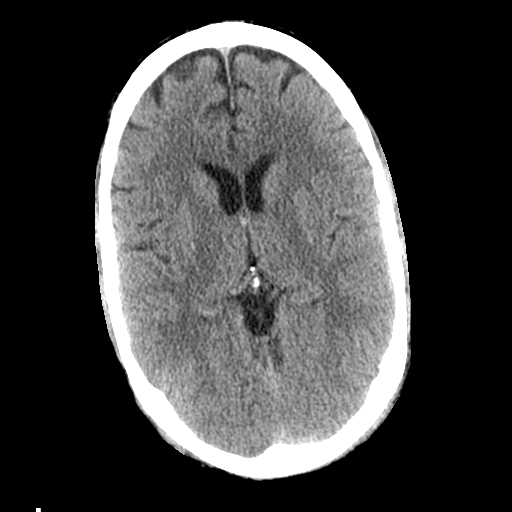
[im 15/33  bone]
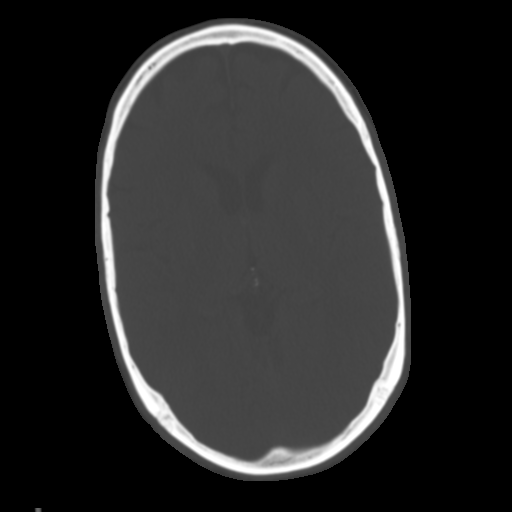
[im 18/33  brain]
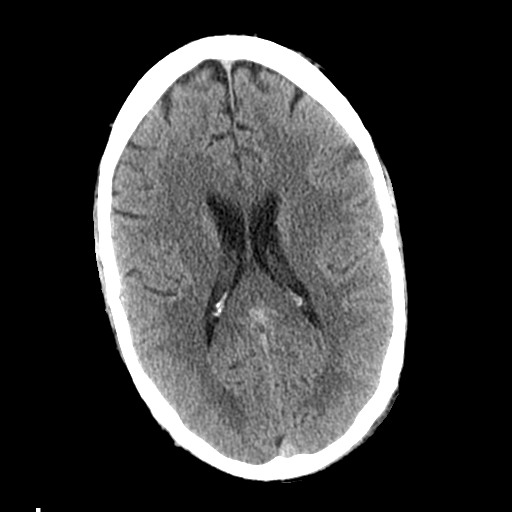
[im 21/33  brain]
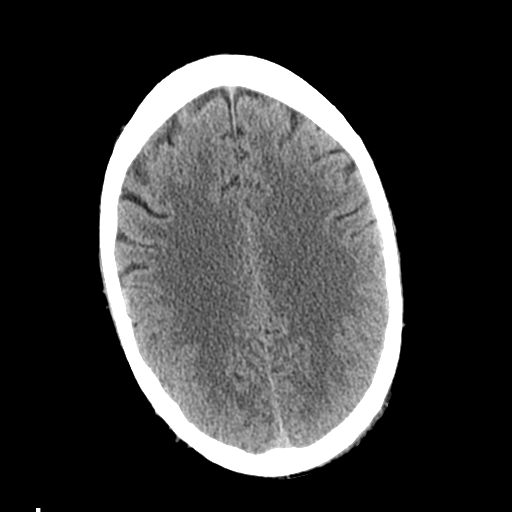
[im 25/33  brain]
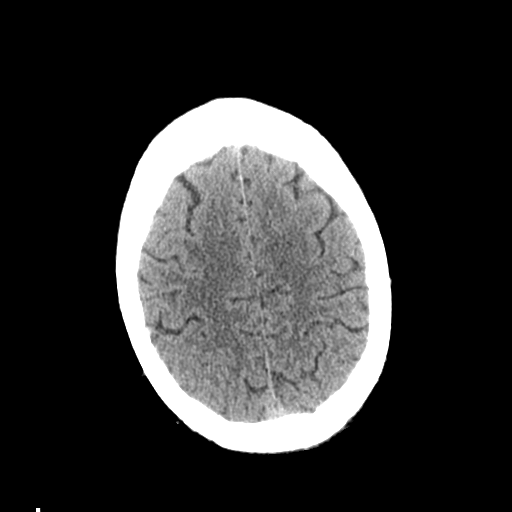
[im 27/33  brain]
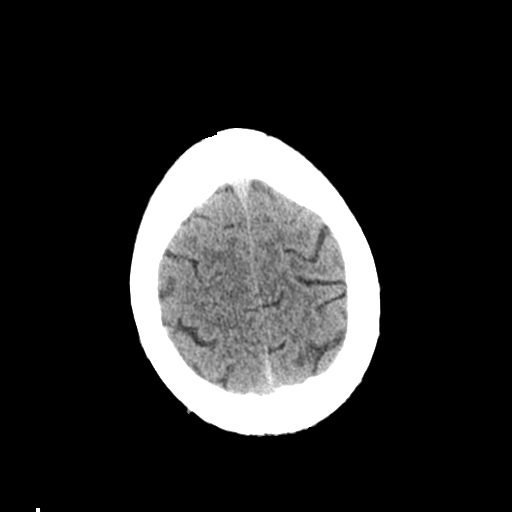
[im 27/33  bone]
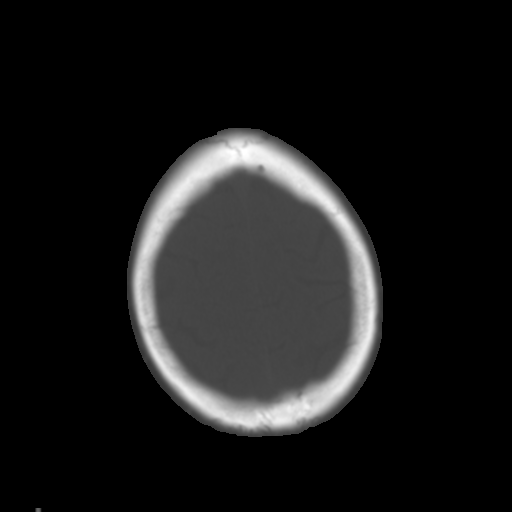
[im 30/33  brain]
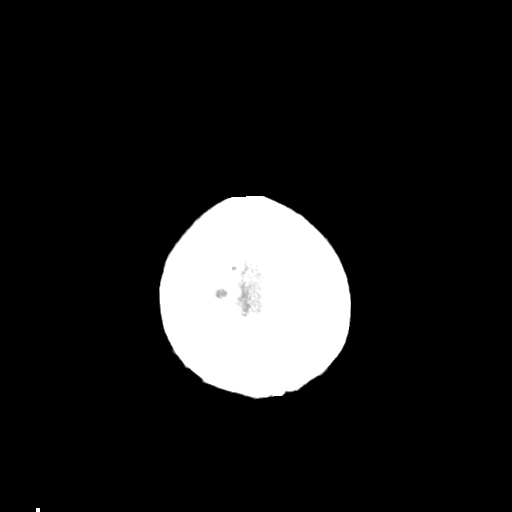

[Series 5: coronal soft tissue · coronal · 0.31mm/px · 3 of 76 slices shown]
[im 26/76  brain]
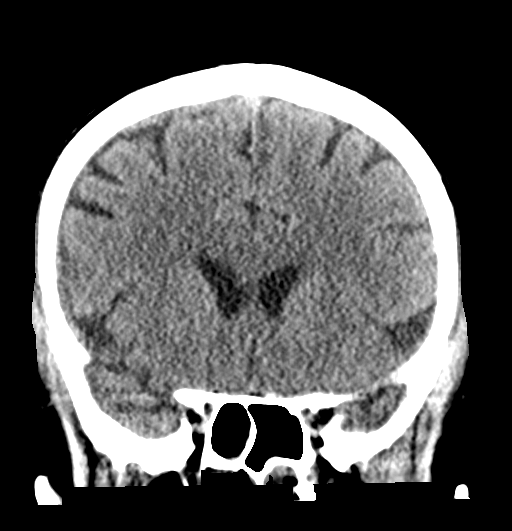
[im 34/76  brain]
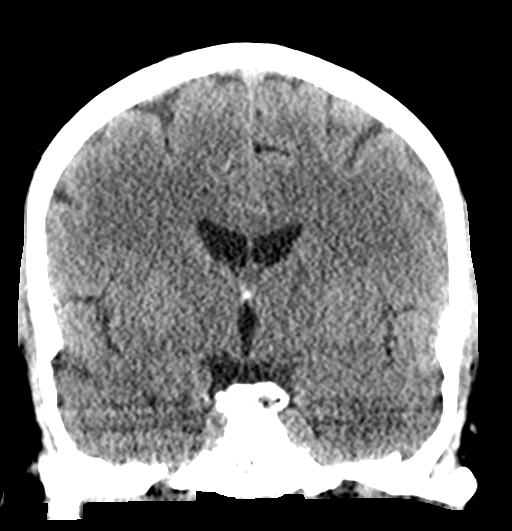
[im 42/76  brain]
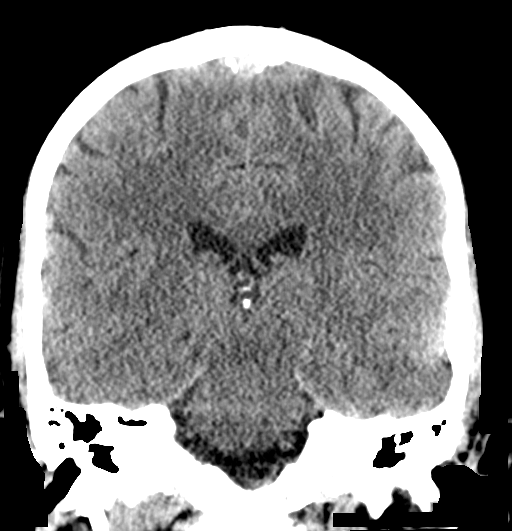

[Series 6: sagittal soft tissue · sagittal · 0.32mm/px · 3 of 53 slices shown]
[im 18/53  brain]
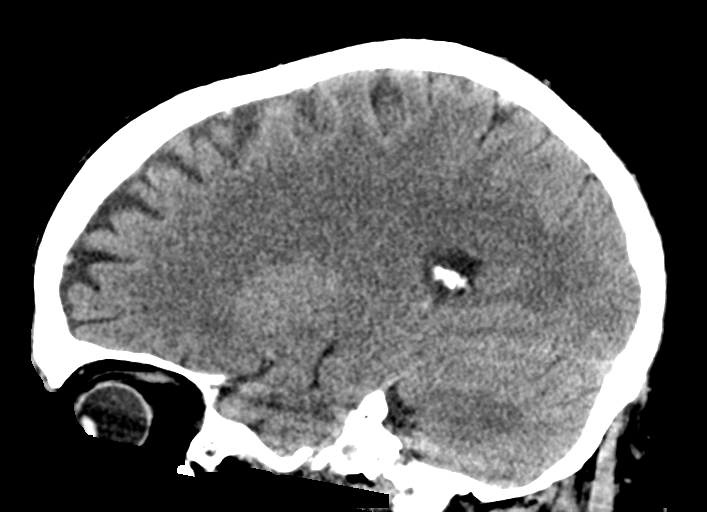
[im 27/53  brain]
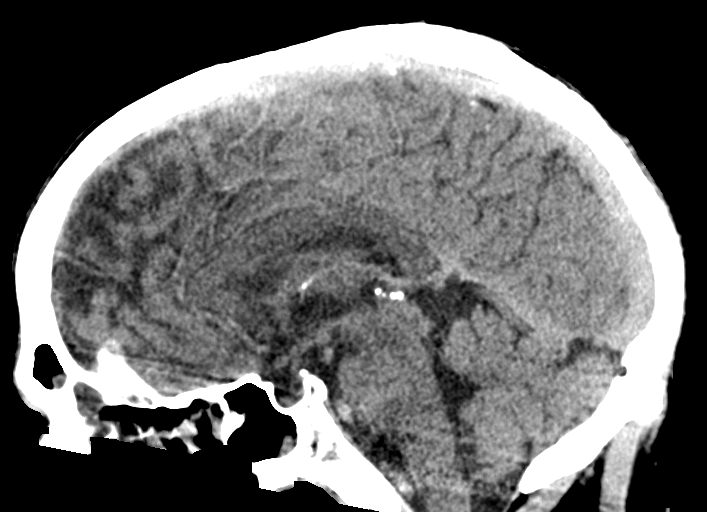
[im 35/53  brain]
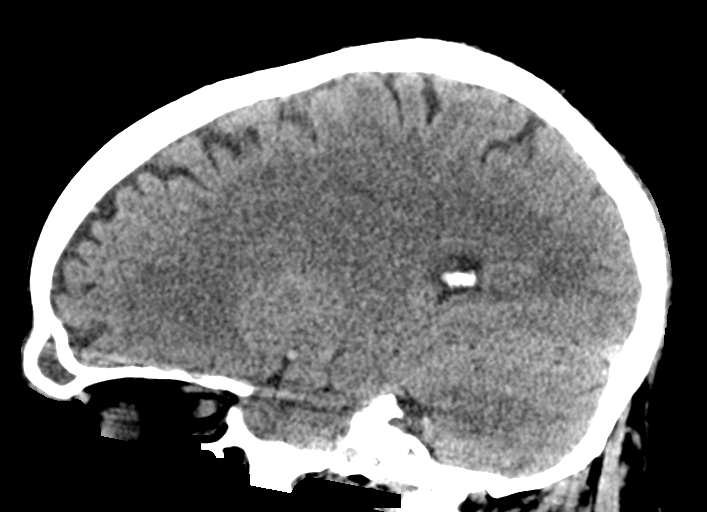

[16 of 47 positions shown; findings below may reference images not displayed]

FINDINGS: Brain: No acute intracranial findings are seen. There are no signs
of bleeding. Ventricles are not dilated. There is no focal edema or
mass effect.

Vascular: Unremarkable.

Skull: Unremarkable.

Sinuses/Orbits: Mucosal thickening is seen in the frontal, ethmoid
and sphenoid sinuses. Mucous retention cyst is seen in the
visualized upper portion of left maxillary sinus.

Other: None.
IMPRESSION: No acute intracranial findings are seen in noncontrast CT brain.

Chronic sinusitis.

## 2023-02-03 ENCOUNTER — Other Ambulatory Visit: Payer: Self-pay | Admitting: Physician Assistant

## 2023-02-03 ENCOUNTER — Ambulatory Visit
Admission: RE | Admit: 2023-02-03 | Discharge: 2023-02-03 | Disposition: A | Discharge status: Home / Self Care | Payer: BC Managed Care – PPO | Source: Ambulatory Visit | Attending: Physician Assistant | Admitting: Physician Assistant

## 2023-02-03 DIAGNOSIS — M25551 Pain in right hip: Secondary | ICD-10-CM

## 2023-02-03 DIAGNOSIS — M48061 Spinal stenosis, lumbar region without neurogenic claudication: Secondary | ICD-10-CM | POA: Diagnosis not present

## 2023-02-03 DIAGNOSIS — M25552 Pain in left hip: Secondary | ICD-10-CM | POA: Diagnosis not present

## 2023-02-03 DIAGNOSIS — M5136 Other intervertebral disc degeneration, lumbar region: Secondary | ICD-10-CM | POA: Diagnosis not present

## 2023-02-03 DIAGNOSIS — M1712 Unilateral primary osteoarthritis, left knee: Secondary | ICD-10-CM | POA: Diagnosis not present

## 2023-02-03 DIAGNOSIS — M160 Bilateral primary osteoarthritis of hip: Secondary | ICD-10-CM | POA: Diagnosis not present

## 2023-02-03 DIAGNOSIS — M1612 Unilateral primary osteoarthritis, left hip: Secondary | ICD-10-CM | POA: Diagnosis not present

## 2023-02-03 DIAGNOSIS — M1611 Unilateral primary osteoarthritis, right hip: Secondary | ICD-10-CM | POA: Diagnosis not present

## 2023-02-17 DIAGNOSIS — M25552 Pain in left hip: Secondary | ICD-10-CM | POA: Diagnosis not present

## 2023-02-17 DIAGNOSIS — M25551 Pain in right hip: Secondary | ICD-10-CM | POA: Diagnosis not present

## 2023-02-24 ENCOUNTER — Other Ambulatory Visit: Payer: Self-pay | Admitting: Sports Medicine

## 2023-02-24 DIAGNOSIS — M25552 Pain in left hip: Secondary | ICD-10-CM

## 2023-03-18 ENCOUNTER — Ambulatory Visit
Admission: RE | Admit: 2023-03-18 | Discharge: 2023-03-18 | Disposition: A | Discharge status: Home / Self Care | Payer: BC Managed Care – PPO | Source: Ambulatory Visit | Attending: Sports Medicine | Admitting: Sports Medicine

## 2023-03-18 DIAGNOSIS — M25552 Pain in left hip: Secondary | ICD-10-CM

## 2023-03-18 DIAGNOSIS — M79604 Pain in right leg: Secondary | ICD-10-CM | POA: Diagnosis not present

## 2023-03-18 DIAGNOSIS — M25512 Pain in left shoulder: Secondary | ICD-10-CM | POA: Diagnosis not present

## 2023-07-28 DIAGNOSIS — J301 Allergic rhinitis due to pollen: Secondary | ICD-10-CM | POA: Diagnosis not present

## 2023-07-28 DIAGNOSIS — J4530 Mild persistent asthma, uncomplicated: Secondary | ICD-10-CM | POA: Diagnosis not present

## 2023-07-28 DIAGNOSIS — J3081 Allergic rhinitis due to animal (cat) (dog) hair and dander: Secondary | ICD-10-CM | POA: Diagnosis not present

## 2023-07-28 DIAGNOSIS — J3089 Other allergic rhinitis: Secondary | ICD-10-CM | POA: Diagnosis not present

## 2023-08-14 DIAGNOSIS — F439 Reaction to severe stress, unspecified: Secondary | ICD-10-CM | POA: Diagnosis not present

## 2023-08-20 ENCOUNTER — Ambulatory Visit (INDEPENDENT_AMBULATORY_CARE_PROVIDER_SITE_OTHER): Payer: BC Managed Care – PPO | Admitting: Psychology

## 2023-08-20 DIAGNOSIS — F4321 Adjustment disorder with depressed mood: Secondary | ICD-10-CM | POA: Diagnosis not present

## 2023-08-20 DIAGNOSIS — Z630 Problems in relationship with spouse or partner: Secondary | ICD-10-CM | POA: Diagnosis not present
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
## 2023-08-26 ENCOUNTER — Ambulatory Visit (INDEPENDENT_AMBULATORY_CARE_PROVIDER_SITE_OTHER): Payer: BC Managed Care – PPO | Admitting: Psychology

## 2023-08-26 DIAGNOSIS — F4321 Adjustment disorder with depressed mood: Secondary | ICD-10-CM
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
## 2023-09-18 ENCOUNTER — Ambulatory Visit: Payer: BC Managed Care – PPO | Admitting: Psychology

## 2023-09-18 DIAGNOSIS — Z630 Problems in relationship with spouse or partner: Secondary | ICD-10-CM

## 2023-09-18 DIAGNOSIS — F4321 Adjustment disorder with depressed mood: Secondary | ICD-10-CM
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
## 2023-09-22 ENCOUNTER — Other Ambulatory Visit (HOSPITAL_COMMUNITY): Payer: Self-pay | Admitting: Physician Assistant

## 2023-09-22 DIAGNOSIS — N529 Male erectile dysfunction, unspecified: Secondary | ICD-10-CM | POA: Diagnosis not present

## 2023-09-22 DIAGNOSIS — F419 Anxiety disorder, unspecified: Secondary | ICD-10-CM | POA: Diagnosis not present

## 2023-09-22 DIAGNOSIS — E7800 Pure hypercholesterolemia, unspecified: Secondary | ICD-10-CM

## 2023-09-22 DIAGNOSIS — Z0000 Encounter for general adult medical examination without abnormal findings: Secondary | ICD-10-CM | POA: Diagnosis not present

## 2023-09-22 DIAGNOSIS — Z125 Encounter for screening for malignant neoplasm of prostate: Secondary | ICD-10-CM | POA: Diagnosis not present

## 2023-09-22 DIAGNOSIS — Z23 Encounter for immunization: Secondary | ICD-10-CM | POA: Diagnosis not present

## 2023-09-22 DIAGNOSIS — G2581 Restless legs syndrome: Secondary | ICD-10-CM | POA: Diagnosis not present

## 2023-09-22 DIAGNOSIS — J4530 Mild persistent asthma, uncomplicated: Secondary | ICD-10-CM | POA: Diagnosis not present

## 2023-09-25 ENCOUNTER — Ambulatory Visit: Payer: BC Managed Care – PPO | Admitting: Psychology

## 2023-09-30 ENCOUNTER — Ambulatory Visit (HOSPITAL_COMMUNITY)
Admission: RE | Admit: 2023-09-30 | Discharge: 2023-09-30 | Disposition: A | Discharge status: Home / Self Care | Payer: Self-pay | Source: Ambulatory Visit | Attending: Physician Assistant | Admitting: Physician Assistant

## 2023-09-30 DIAGNOSIS — E7800 Pure hypercholesterolemia, unspecified: Secondary | ICD-10-CM | POA: Insufficient documentation

## 2023-10-01 ENCOUNTER — Ambulatory Visit: Payer: BC Managed Care – PPO | Admitting: Psychology

## 2023-10-01 DIAGNOSIS — F4321 Adjustment disorder with depressed mood: Secondary | ICD-10-CM
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
## 2023-10-08 DIAGNOSIS — R931 Abnormal findings on diagnostic imaging of heart and coronary circulation: Secondary | ICD-10-CM | POA: Diagnosis not present

## 2023-10-22 DIAGNOSIS — Z1211 Encounter for screening for malignant neoplasm of colon: Secondary | ICD-10-CM | POA: Diagnosis not present

## 2023-10-22 DIAGNOSIS — Z1212 Encounter for screening for malignant neoplasm of rectum: Secondary | ICD-10-CM | POA: Diagnosis not present

## 2023-12-18 DIAGNOSIS — B078 Other viral warts: Secondary | ICD-10-CM | POA: Diagnosis not present

## 2023-12-18 DIAGNOSIS — L821 Other seborrheic keratosis: Secondary | ICD-10-CM | POA: Diagnosis not present

## 2023-12-18 DIAGNOSIS — D225 Melanocytic nevi of trunk: Secondary | ICD-10-CM | POA: Diagnosis not present

## 2023-12-18 DIAGNOSIS — L918 Other hypertrophic disorders of the skin: Secondary | ICD-10-CM | POA: Diagnosis not present

## 2023-12-18 DIAGNOSIS — Z85828 Personal history of other malignant neoplasm of skin: Secondary | ICD-10-CM | POA: Diagnosis not present

## 2023-12-18 DIAGNOSIS — B079 Viral wart, unspecified: Secondary | ICD-10-CM | POA: Diagnosis not present

## 2023-12-18 DIAGNOSIS — L812 Freckles: Secondary | ICD-10-CM | POA: Diagnosis not present

## 2023-12-25 NOTE — Progress Notes (Signed)
 Cardiology Office Note:  .   Date:  12/26/2023  ID:  Ruben Snyder, DOB 05-12-1961, MRN [id number] PCP: Delma Officer, PA  [facility] Providers Cardiologist:  Jodelle Red, MD {  History of Present Illness: .   Ruben Snyder is a 63 y.o. male with PMH asthma, anxiety who is seen in consultation for elevated calcium score and hypercholesterolemia.  Pertinent CV history: Ca score 09/2023 with focal calcification in LAD of 570 (90th %ile). Baseline lipids 09/22/2023 Tchol ,231 LDL 141, HDL 60, TG 169.  Today: Referred by Eliane Decree, PA on 10/07/23 for hypercholesterolemia and elevated calcium score (above). Note from 09/22/23 reviewed. Noted prior history of elevated cholesterol levels while on keto diet. Had remote stress test >10 years ago as mother had history of mitral valve prolapse.  Reviewed his calcium score with him today. Reviewed his actual images, example images, biology of plaque, and management. Has been on rosuvastatin for about 90 days. Baseline labs showed Tchol 231, HDL 60, LDL 141, TG 169. Has changed diet from paleo/keto since his CT scan to avoiding cholesterol.  Peak weight was >250 lbs, lost weight with diet changes.  Has occasional chest tightness which he had attributed to his asthma, mild and relieved by albuterol, occurs with asthma exacerbation. No chest pain on exertion. He has hip arthritis, which limits some of his activity. Does upper arm strength training with weights. Also sails, involves climbing stairs and other activity. Hoping for hip replacement in the future.  Cardiovascular risk factors: Prior clinical ASCVD: none Comorbid conditions: Denies hypertension, diabetes, chronic kidney disease  Chronic inflammatory conditions: none Tobacco use history: has used nicotine replacement for years, former smoker/snuff use Alcohol: stopped drinking 2.5 years ago, normalized blood pressure Family history: mother with mitral  valve prolapse, aunt with carotid stents. Doesn't know much about father's history.  ROS: Denies chest pain, shortness of breath at rest or with normal exertion. No PND, orthopnea, LE edema or unexpected weight gain. No syncope or palpitations. ROS otherwise negative except as noted.   Studies Reviewed: Marland Kitchen    EKG:  EKG Interpretation Date/Time:  Friday December 26 2023 09:46:36 EST Ventricular Rate:  69 PR Interval:  152 QRS Duration:  92 QT Interval:  364 QTC Calculation: 390 R Axis:   10  Text Interpretation: Normal sinus rhythm Normal ECG Confirmed by Jodelle Red [phone] on 12/26/2023 10:19:45 AM    Physical Exam:   VS:  BP 114/82   Pulse 80   Ht 6\' 3"  (1.905 m)   Wt 242 lb 4.8 oz (109.9 kg)   SpO2 92%   BMI 30.29 kg/m    Wt Readings from Last 3 Encounters:  12/26/23 242 lb 4.8 oz (109.9 kg)  06/16/18 222 lb (100.7 kg)  01/26/18 221 lb (100.2 kg)    GEN: Well nourished, well developed in no acute distress HEENT: Normal, moist mucous membranes NECK: No JVD CARDIAC: regular rhythm, normal S1 and S2, no rubs or gallops. No murmur. VASCULAR: Radial and DP pulses 2+ bilaterally. No carotid bruits RESPIRATORY:  Clear to auscultation without rales, wheezing or rhonchi  ABDOMEN: Soft, non-tender, non-distended MUSCULOSKELETAL:  Ambulates independently SKIN: Warm and dry, no edema NEUROLOGIC:  Alert and oriented x 3. No focal neuro deficits noted. PSYCHIATRIC:  Normal affect    ASSESSMENT AND PLAN: .    Elevated coronary calcium score, consistent with CAD Mixed hyperlipidemia -has been on rosuvastatin about 3 mos. See baseline lipids above -initially had headaches on  rosuvastatin, now tolerating well -we reviewed his test together, including his actual images, discussed recommendations for management -will recheck lipids/lfts now that he has been on statin for 3 mos -discussed lp(a), he is amenable -discussed exercise treadmill stress test given his score >400.  He is amenable. -reviewed red flag warning signs that need immediate medical attention -discussed aspirin. He is amenable. Has issues with nasal congestion with NSAIDs, will trial aspirin and makes sure he tolerates  Informed Consent   Shared Decision Making/Informed Consent The risks [chest pain, shortness of breath, cardiac arrhythmias, dizziness, blood pressure fluctuations, myocardial infarction, stroke/transient ischemic attack, and life-threatening complications (estimated to be 1 in 10,000)], benefits (risk stratification, diagnosing coronary artery disease, treatment guidance) and alternatives of an exercise tolerance test were discussed in detail with Mr. Buerkle and he agrees to proceed.      CV risk counseling and prevention -recommend heart healthy/Mediterranean diet, with whole grains, fruits, vegetable, fish, lean meats, nuts, and olive oil. Limit salt. -recommend moderate walking, 3-5 times/week for 30-50 minutes each session. Aim for at least 150 minutes.week. Goal should be pace of 3 miles/hours, or walking 1.5 miles in 30 minutes -recommend avoidance of tobacco products. Avoid excess alcohol.  Dispo: if testing unremarkable, 1 year or sooner as needed  Signed, Jodelle Red, MD   Jodelle Red, MD, PhD, [facility]  [facility]  [place]  Heart & Vascular at [facility] at [facility] [address] [place], [place] [address] [phone]

## 2023-12-26 ENCOUNTER — Encounter (HOSPITAL_BASED_OUTPATIENT_CLINIC_OR_DEPARTMENT_OTHER): Payer: Self-pay | Admitting: Cardiology

## 2023-12-26 ENCOUNTER — Ambulatory Visit (HOSPITAL_BASED_OUTPATIENT_CLINIC_OR_DEPARTMENT_OTHER): Payer: BC Managed Care – PPO | Admitting: Cardiology

## 2023-12-26 VITALS — BP 114/82 | HR 80 | Ht 75.0 in | Wt 242.3 lb

## 2023-12-26 DIAGNOSIS — Z79899 Other long term (current) drug therapy: Secondary | ICD-10-CM | POA: Diagnosis not present

## 2023-12-26 DIAGNOSIS — I2510 Atherosclerotic heart disease of native coronary artery without angina pectoris: Secondary | ICD-10-CM | POA: Diagnosis not present

## 2023-12-26 DIAGNOSIS — E782 Mixed hyperlipidemia: Secondary | ICD-10-CM | POA: Diagnosis not present

## 2023-12-26 DIAGNOSIS — R931 Abnormal findings on diagnostic imaging of heart and coronary circulation: Secondary | ICD-10-CM

## 2023-12-26 DIAGNOSIS — Z7189 Other specified counseling: Secondary | ICD-10-CM

## 2023-12-26 MED ORDER — ASPIRIN 81 MG PO TBEC
81.0000 mg | DELAYED_RELEASE_TABLET | Freq: Every day | ORAL | Status: AC
Start: 2023-12-26 — End: ?

## 2023-12-26 NOTE — Patient Instructions (Addendum)
 Medication Instructions:  Your physician has recommended you make the following change in your medication:  START Asprin 81 mg daily  Lab Work: Your physician recommends that you return for lab work: LIPIDS, LPA and LFT  Testing/Procedures: Your physician recommends that you have a treadmill exercise test.   Follow-Up: At [facility], you and your health needs are our priority.  As part of our continuing mission to provide you with exceptional heart care, we have created designated Provider Care Teams.  These Care Teams include your primary Cardiologist (physician) and Advanced Practice Providers (APPs -  Physician Assistants and Nurse Practitioners) who all work together to provide you with the care you need, when you need it.  We recommend signing up for the patient portal called "MyChart".  Sign up information is provided on this After Visit Summary.  MyChart is used to connect with patients for Virtual Visits (Telemedicine).  Patients are able to view lab/test results, encounter notes, upcoming appointments, etc.  Non-urgent messages can be sent to your provider as well.   To learn more about what you can do with MyChart, go to [email or web address].    Your next appointment:   1 year(s)  Provider:   Jodelle Red, MD

## 2024-01-06 ENCOUNTER — Ambulatory Visit: Payer: BC Managed Care – PPO | Attending: Cardiology

## 2024-01-06 DIAGNOSIS — R931 Abnormal findings on diagnostic imaging of heart and coronary circulation: Secondary | ICD-10-CM

## 2024-01-06 DIAGNOSIS — I2510 Atherosclerotic heart disease of native coronary artery without angina pectoris: Secondary | ICD-10-CM | POA: Diagnosis not present

## 2024-01-06 LAB — EXERCISE TOLERANCE TEST
Angina Index: 0
Duke Treadmill Score: 6
Estimated workload: 7
Exercise duration (min): 6 min
Exercise duration (sec): 0 s
MPHR: 157 {beats}/min
Peak HR: 151 {beats}/min
Percent HR: 96 %
RPE: 17
Rest HR: 84 {beats}/min
ST Depression (mm): 0 mm

## 2024-01-14 ENCOUNTER — Encounter (HOSPITAL_BASED_OUTPATIENT_CLINIC_OR_DEPARTMENT_OTHER): Payer: Self-pay

## 2024-04-23 DIAGNOSIS — H52223 Regular astigmatism, bilateral: Secondary | ICD-10-CM | POA: Diagnosis not present

## 2024-06-18 DIAGNOSIS — J4530 Mild persistent asthma, uncomplicated: Secondary | ICD-10-CM | POA: Diagnosis not present

## 2024-06-18 DIAGNOSIS — F411 Generalized anxiety disorder: Secondary | ICD-10-CM | POA: Diagnosis not present

## 2024-07-29 DIAGNOSIS — R490 Dysphonia: Secondary | ICD-10-CM | POA: Diagnosis not present

## 2024-07-29 DIAGNOSIS — J301 Allergic rhinitis due to pollen: Secondary | ICD-10-CM | POA: Diagnosis not present

## 2024-07-29 DIAGNOSIS — R439 Unspecified disturbances of smell and taste: Secondary | ICD-10-CM | POA: Diagnosis not present

## 2024-07-29 DIAGNOSIS — R0981 Nasal congestion: Secondary | ICD-10-CM | POA: Diagnosis not present

## 2024-07-29 DIAGNOSIS — J4530 Mild persistent asthma, uncomplicated: Secondary | ICD-10-CM | POA: Diagnosis not present

## 2024-07-29 DIAGNOSIS — J3089 Other allergic rhinitis: Secondary | ICD-10-CM | POA: Diagnosis not present

## 2024-07-29 DIAGNOSIS — J3081 Allergic rhinitis due to animal (cat) (dog) hair and dander: Secondary | ICD-10-CM | POA: Diagnosis not present

## 2024-08-13 DIAGNOSIS — M791 Myalgia, unspecified site: Secondary | ICD-10-CM | POA: Diagnosis not present

## 2024-08-13 DIAGNOSIS — G729 Myopathy, unspecified: Secondary | ICD-10-CM | POA: Diagnosis not present

## 2024-08-13 DIAGNOSIS — Z23 Encounter for immunization: Secondary | ICD-10-CM | POA: Diagnosis not present

## 2024-08-13 DIAGNOSIS — E785 Hyperlipidemia, unspecified: Secondary | ICD-10-CM | POA: Diagnosis not present

## 2024-08-13 DIAGNOSIS — G2581 Restless legs syndrome: Secondary | ICD-10-CM | POA: Diagnosis not present

## 2024-09-22 DIAGNOSIS — J301 Allergic rhinitis due to pollen: Secondary | ICD-10-CM | POA: Diagnosis not present

## 2024-09-22 DIAGNOSIS — J4530 Mild persistent asthma, uncomplicated: Secondary | ICD-10-CM | POA: Diagnosis not present

## 2024-09-22 DIAGNOSIS — G2581 Restless legs syndrome: Secondary | ICD-10-CM | POA: Diagnosis not present

## 2024-09-22 DIAGNOSIS — Z0000 Encounter for general adult medical examination without abnormal findings: Secondary | ICD-10-CM | POA: Diagnosis not present

## 2024-09-22 DIAGNOSIS — E7800 Pure hypercholesterolemia, unspecified: Secondary | ICD-10-CM | POA: Diagnosis not present

## 2024-09-22 DIAGNOSIS — Z125 Encounter for screening for malignant neoplasm of prostate: Secondary | ICD-10-CM | POA: Diagnosis not present
